# Patient Record
Sex: Female | Born: 1990
Health system: Southern US, Community
[De-identification: ages and names within clinical notes are randomized; demographics above are authoritative.]

## PROBLEM LIST (undated history)

## (undated) DIAGNOSIS — J45909 Unspecified asthma, uncomplicated: Secondary | ICD-10-CM

## (undated) DIAGNOSIS — Z98891 History of uterine scar from previous surgery: Secondary | ICD-10-CM

## (undated) DIAGNOSIS — G43909 Migraine, unspecified, not intractable, without status migrainosus: Secondary | ICD-10-CM

## (undated) DIAGNOSIS — D649 Anemia, unspecified: Secondary | ICD-10-CM

## (undated) DIAGNOSIS — Z9109 Other allergy status, other than to drugs and biological substances: Secondary | ICD-10-CM

## (undated) DIAGNOSIS — N390 Urinary tract infection, site not specified: Secondary | ICD-10-CM

## (undated) DIAGNOSIS — A599 Trichomoniasis, unspecified: Secondary | ICD-10-CM

## (undated) DIAGNOSIS — K589 Irritable bowel syndrome without diarrhea: Secondary | ICD-10-CM

## (undated) HISTORY — DX: Other allergy status, other than to drugs and biological substances: Z91.09

## (undated) HISTORY — DX: Urinary tract infection, site not specified: N39.0

## (undated) HISTORY — DX: Anemia, unspecified: D64.9

## (undated) HISTORY — DX: Unspecified asthma, uncomplicated: J45.909

## (undated) HISTORY — DX: Migraine, unspecified, not intractable, without status migrainosus: G43.909

## (undated) HISTORY — PX: NO PAST SURGERIES: SHX2092

---
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
## 2012-02-12 ENCOUNTER — Encounter (HOSPITAL_COMMUNITY): Payer: Self-pay

## 2012-02-12 ENCOUNTER — Emergency Department (HOSPITAL_COMMUNITY): Payer: No Typology Code available for payment source

## 2012-02-12 ENCOUNTER — Emergency Department (HOSPITAL_COMMUNITY)
Admission: EM | Admit: 2012-02-12 | Discharge: 2012-02-12 | Disposition: A | Discharge status: Home / Self Care | Payer: No Typology Code available for payment source | Attending: Emergency Medicine | Admitting: Emergency Medicine

## 2012-02-12 DIAGNOSIS — M25569 Pain in unspecified knee: Secondary | ICD-10-CM

## 2012-02-12 DIAGNOSIS — M25539 Pain in unspecified wrist: Secondary | ICD-10-CM | POA: Insufficient documentation

## 2012-02-12 DIAGNOSIS — M79609 Pain in unspecified limb: Secondary | ICD-10-CM | POA: Insufficient documentation

## 2012-02-12 DIAGNOSIS — Y92410 Unspecified street and highway as the place of occurrence of the external cause: Secondary | ICD-10-CM | POA: Insufficient documentation

## 2012-02-12 MED ORDER — IBUPROFEN 800 MG PO TABS: 800.0000 mg | ORAL_TABLET | Freq: Three times a day (TID) | ORAL | Status: DC

## 2012-02-12 MED ORDER — IBUPROFEN 800 MG PO TABS
800.0000 mg | ORAL_TABLET | Freq: Once | ORAL | Status: AC
  Administered 2012-02-12: 800 mg via ORAL
  Filled 2012-02-12: qty 1
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
## 2012-02-12 NOTE — Discharge Instructions (Signed)
Use ice and elevate above the heart tonight x 24 hours.  Followup with orthopedic physician if you are not better in a couple days. He today was normal did not show any fractures.   Knee Pain The knee is the complex joint between your thigh and your lower leg. It is made up of bones, tendons, ligaments, and cartilage. The bones that make up the knee are:  The femur in the thigh.   The tibia and fibula in the lower leg.   The patella or kneecap riding in the groove on the lower femur.  CAUSES  Knee pain is a common complaint with many causes. A few of these causes are:  Injury, such as:   A ruptured ligament or tendon injury.   Torn cartilage.   Medical conditions, such as:   Gout   Arthritis   Infections   Overuse, over training or overdoing a physical activity.  Knee pain can be minor or severe. Knee pain can accompany debilitating injury. Minor knee problems often respond well to self-care measures or get well on their own. More serious injuries may need medical intervention or even surgery. SYMPTOMS The knee is complex. Symptoms of knee problems can vary widely. Some of the problems are:  Pain with movement and weight bearing.   Swelling and tenderness.   Buckling of the knee.   Inability to straighten or extend your knee.   Your knee locks and you cannot straighten it.   Warmth and redness with pain and fever.   Deformity or dislocation of the kneecap.  DIAGNOSIS  Determining what is wrong may be very straight forward such as when there is an injury. It can also be challenging because of the complexity of the knee. Tests to make a diagnosis may include:  Your caregiver taking a history and doing a physical exam.   Routine X-rays can be used to rule out other problems. X-rays will not reveal a cartilage tear. Some injuries of the knee can be diagnosed by:   Arthroscopy a surgical technique by which a small video camera is inserted through tiny incisions on  the sides of the knee. This procedure is used to examine and repair internal knee joint problems. Tiny instruments can be used during arthroscopy to repair the torn knee cartilage (meniscus).   Arthrography is a radiology technique. A contrast liquid is directly injected into the knee joint. Internal structures of the knee joint then become visible on X-ray film.   An MRI scan is a non x-ray radiology procedure in which magnetic fields and a computer produce two- or three-dimensional images of the inside of the knee. Cartilage tears are often visible using an MRI scanner. MRI scans have largely replaced arthrography in diagnosing cartilage tears of the knee.   Blood work.   Examination of the fluid that helps to lubricate the knee joint (synovial fluid). This is done by taking a sample out using a needle and a syringe.  TREATMENT The treatment of knee problems depends on the cause. Some of these treatments are:  Depending on the injury, proper casting, splinting, surgery or physical therapy care will be needed.   Give yourself adequate recovery time. Do not overuse your joints. If you begin to get sore during workout routines, back off. Slow down or do fewer repetitions.   For repetitive activities such as cycling or running, maintain your strength and nutrition.   Alternate muscle groups. For example if you are a weight lifter, work the upper  body on one day and the lower body the next.   Either tight or weak muscles do not give the proper support for your knee. Tight or weak muscles do not absorb the stress placed on the knee joint. Keep the muscles surrounding the knee strong.   Take care of mechanical problems.   If you have flat feet, orthotics or special shoes may help. See your caregiver if you need help.   Arch supports, sometimes with wedges on the inner or outer aspect of the heel, can help. These can shift pressure away from the side of the knee most bothered by osteoarthritis.     A brace called an "unloader" brace also may be used to help ease the pressure on the most arthritic side of the knee.   If your caregiver has prescribed crutches, braces, wraps or ice, use as directed. The acronym for this is PRICE. This means protection, rest, ice, compression and elevation.   Nonsteroidal anti-inflammatory drugs (NSAID's), can help relieve pain. But if taken immediately after an injury, they may actually increase swelling. Take NSAID's with food in your stomach. Stop them if you develop stomach problems. Do not take these if you have a history of ulcers, stomach pain or bleeding from the bowel. Do not take without your caregiver's approval if you have problems with fluid retention, heart failure, or kidney problems.   For ongoing knee problems, physical therapy may be helpful.   Glucosamine and chondroitin are over-the-counter dietary supplements. Both may help relieve the pain of osteoarthritis in the knee. These medicines are different from the usual anti-inflammatory drugs. Glucosamine may decrease the rate of cartilage destruction.   Injections of a corticosteroid drug into your knee joint may help reduce the symptoms of an arthritis flare-up. They may provide pain relief that lasts a few months. You may have to wait a few months between injections. The injections do have a small increased risk of infection, water retention and elevated blood sugar levels.   Hyaluronic acid injected into damaged joints may ease pain and provide lubrication. These injections may work by reducing inflammation. A series of shots may give relief for as long as 6 months.   Topical painkillers. Applying certain ointments to your skin may help relieve the pain and stiffness of osteoarthritis. Ask your pharmacist for suggestions. Many over the-counter products are approved for temporary relief of arthritis pain.   In some countries, doctors often prescribe topical NSAID's for relief of chronic  conditions such as arthritis and tendinitis. A review of treatment with NSAID creams found that they worked as well as oral medications but without the serious side effects.  PREVENTION  Maintain a healthy weight. Extra pounds put more strain on your joints.   Get strong, stay limber. Weak muscles are a common cause of knee injuries. Stretching is important. Include flexibility exercises in your workouts.   Be smart about exercise. If you have osteoarthritis, chronic knee pain or recurring injuries, you may need to change the way you exercise. This does not mean you have to stop being active. If your knees ache after jogging or playing basketball, consider switching to swimming, water aerobics or other low-impact activities, at least for a few days a week. Sometimes limiting high-impact activities will provide relief.   Make sure your shoes fit well. Choose footwear that is right for your sport.   Protect your knees. Use the proper gear for knee-sensitive activities. Use kneepads when playing volleyball or laying carpet. Buckle your  seat belt every time you drive. Most shattered kneecaps occur in car accidents.   Rest when you are tired.  SEEK MEDICAL CARE IF:  You have knee pain that is continual and does not seem to be getting better.  SEEK IMMEDIATE MEDICAL CARE IF:  Your knee joint feels hot to the touch and you have a high fever. MAKE SURE YOU:   Understand these instructions.   Will watch your condition.   Will get help right away if you are not doing well or get worse.  Document Released: 10/14/2007 Document Revised: 08/29/2011 Document Reviewed: 10/14/2007 [employer] Patient Information 2012 [place], [place].
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
## 2012-02-12 NOTE — ED Notes (Signed)
Patient and family verbalized complete understanding of all the d/c instructions
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
## 2012-02-15 ENCOUNTER — Encounter (HOSPITAL_COMMUNITY): Payer: Self-pay | Admitting: *Deleted

## 2012-02-15 ENCOUNTER — Emergency Department (HOSPITAL_COMMUNITY)
Admission: EM | Admit: 2012-02-15 | Discharge: 2012-02-15 | Disposition: A | Discharge status: Home / Self Care | Payer: No Typology Code available for payment source | Attending: Emergency Medicine | Admitting: Emergency Medicine

## 2012-02-15 DIAGNOSIS — M79609 Pain in unspecified limb: Secondary | ICD-10-CM | POA: Insufficient documentation

## 2012-02-15 DIAGNOSIS — E669 Obesity, unspecified: Secondary | ICD-10-CM | POA: Insufficient documentation

## 2012-02-15 MED ORDER — CYCLOBENZAPRINE HCL 5 MG PO TABS: 5.0000 mg | ORAL_TABLET | Freq: Three times a day (TID) | ORAL | Status: AC | PRN

## 2012-02-15 NOTE — ED Provider Notes (Signed)
Medical screening examination/treatment/procedure(s) were performed by non-physician practitioner and as supervising physician I was immediately available for consultation/collaboration.   Nadalie Laughner L Areli Jowett, MD 02/15/12 1210 
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
## 2012-02-16 NOTE — ED Provider Notes (Signed)
Medical screening examination/treatment/procedure(s) were performed by non-physician practitioner and as supervising physician I was immediately available for consultation/collaboration.  Doug Sou, MD 02/16/12 [phone]

## 2012-11-20 ENCOUNTER — Emergency Department (HOSPITAL_COMMUNITY)
Admission: EM | Admit: 2012-11-20 | Discharge: 2012-11-20 | Payer: Managed Care, Other (non HMO) | Source: Home / Self Care

## 2012-11-21 ENCOUNTER — Ambulatory Visit (INDEPENDENT_AMBULATORY_CARE_PROVIDER_SITE_OTHER): Payer: Managed Care, Other (non HMO) | Admitting: Family Medicine

## 2012-11-21 VITALS — BP 115/76 | HR 75 | Temp 98.3°F | Resp 18 | Ht 69.5 in | Wt 363.0 lb

## 2012-11-21 DIAGNOSIS — K30 Functional dyspepsia: Secondary | ICD-10-CM

## 2012-11-21 DIAGNOSIS — R51 Headache: Secondary | ICD-10-CM

## 2012-11-21 DIAGNOSIS — K3183 Achlorhydria: Secondary | ICD-10-CM

## 2012-11-21 DIAGNOSIS — R109 Unspecified abdominal pain: Secondary | ICD-10-CM

## 2012-11-21 DIAGNOSIS — R404 Transient alteration of awareness: Secondary | ICD-10-CM

## 2012-11-21 LAB — BASIC METABOLIC PANEL
BUN: 7 mg/dL (ref 6–23)
CO2: 27 mEq/L (ref 19–32)
Chloride: 106 mEq/L (ref 96–112)
Creat: 0.71 mg/dL (ref 0.50–1.10)
Potassium: 4.2 mEq/L (ref 3.5–5.3)

## 2012-11-21 LAB — POCT CBC
Hemoglobin: 13 g/dL (ref 12.2–16.2)
Lymph, poc: 2.2 (ref 0.6–3.4)
MCH, POC: 23.6 pg — AB (ref 27–31.2)
MCV: 79.3 fL — AB (ref 80–97)
MID (cbc): 0.4 (ref 0–0.9)
POC LYMPH PERCENT: 34 %L (ref 10–50)
RBC: 5.51 M/uL — AB (ref 4.04–5.48)

## 2012-11-21 MED ORDER — BUTALBITAL-APAP-CAFFEINE 50-325-40 MG PO TABS: 1.0000 | ORAL_TABLET | Freq: Three times a day (TID) | ORAL | Status: AC | PRN

## 2012-11-21 MED ORDER — SUCRALFATE 1 G PO TABS: 1.0000 g | ORAL_TABLET | Freq: Four times a day (QID) | ORAL | Status: DC

## 2012-11-21 NOTE — Patient Instructions (Addendum)
You can try the fiorcet medication if your headache returns.  However, be careful not to overuse this medication.  Use the carafate before meals and before bed for the next several days.  If you are not better in the next day or so please come back in or call.    If you notice that you are having persistent headaches they could be due to your birth control pill and you may need to change this medication

## 2012-11-21 NOTE — Progress Notes (Signed)
[facility] [address], [place] [address] [phone]  Date:  11/21/2012   Name:  Sandra Logan   DOB:  [birth year]   MRN:  [id number]  PCP:  Kerby Nora, MD    Chief Complaint: Headache and Abdominal Pain   History of Present Illness:  Sandra Logan is a 22 y.o. very pleasant female patient who presents with the following:  She has been having HA for a couple of days.  She has a history of migraine HA- this seems like a migraine.  She has used ibuprofen for these in the past.  She has tried her ibuprofen about twice a day but it had not helped. However, her HA is actually now much better.  It had been a 5- 6/10, and is now "mild."  It was better when she awoke this morning.   She has pain in the front of her head.  She does have phonophobia and slight phonophobia.   She had some nausea yesterday but no vomiting.  She does not get aura.   No other neurological problems such as weakness or numbness.   She also has noted a "gassy feeling" for the last week.   She also had a loose stool 2 days ago.  She belly feels sore but no specific pain.    She is able to eat ok.   She has a mild cough but thinks this is unrelated.  No other symptoms such as fever or chills   LMP 11/10/12- she is on OCP.  She started this about 6 weeks ago and is on he 2nd pack now  It seems that [place] is most worried that her HA will come back and she will not have any way to treat it.  She also needs a note for her job  Patient Active Problem List  Diagnosis  . TINEA VERSICOLOR  . OBESITY, MORBID  . MIGRAINE, COMMON  . ALLERGIC RHINITIS, SEASONAL  . EXERCISE INDUCED ASTHMA  . INTERTRIGO  . KNEE PAIN, LEFT  . ANKLE PAIN, LEFT    Past Medical History  Diagnosis Date  . Environmental allergies   . Anemia   . Asthma     No past surgical history on file.  History  Substance Use Topics  . Smoking status: Never Smoker   . Smokeless tobacco: Not on file  .  Alcohol Use: No    No family history on file.  No Known Allergies  Medication list has been reviewed and updated.  Current Outpatient Prescriptions on File Prior to Visit  Medication Sig Dispense Refill  . ibuprofen (ADVIL,MOTRIN) 800 MG tablet Take 800 mg by mouth every 8 (eight) hours as needed. For pain.      . norgestimate-ethinyl estradiol (ORTHO-CYCLEN,SPRINTEC,PREVIFEM) 0.25-35 MG-MCG tablet Take 1 tablet by mouth daily.        Review of Systems:  As per HPI- otherwise negative.   Physical Examination: Filed Vitals:   11/21/12 1053  BP: 115/76  Pulse: 75  Temp: 98.3 F (36.8 C)  Resp: 18   Filed Vitals:   11/21/12 1053  Height: 5' 9.5" (1.765 m)  Weight: 363 lb (164.656 kg)   Body mass index is 52.84 kg/(m^2). Ideal Body Weight: Weight in (lb) to have BMI = 25: 171.4   GEN: WDWN, NAD, Non-toxic, A & O x 3, morbid obesity HEENT: Atraumatic, Normocephalic. Neck supple. No masses, No LAD. Bilateral TM wnl, oropharynx normal.  PEERL,EOMI.   Ears and  Nose: No external deformity. CV: RRR, No M/G/R. No JVD. No thrill. No extra heart sounds. PULM: CTA B, no wheezes, crackles, rhonchi. No retractions. No resp. distress. No accessory muscle use. ABD: S, NT, ND, +BS. No rebound. No HSM. EXTR: No c/c/e NEURO Normal gait. Normal strength, sensation and DTR all extremities.  PSYCH: Normally interactive. Conversant. Not depressed or anxious appearing.  Calm demeanor.   Results for orders placed in visit on 11/21/12  POCT CBC      Component Value Range   WBC 6.5  4.6 - 10.2 K/uL   Lymph, poc 2.2  0.6 - 3.4   POC LYMPH PERCENT 34.0  10 - 50 %L   MID (cbc) 0.4  0 - 0.9   POC MID % 6.2  0 - 12 %M   POC Granulocyte 3.9  2 - 6.9   Granulocyte percent 59.8  37 - 80 %G   RBC 5.51 (*) 4.04 - 5.48 M/uL   Hemoglobin 13.0  12.2 - 16.2 g/dL   HCT, POC 56.2  13.0 - 47.9 %   MCV 79.3 (*) 80 - 97 fL   MCH, POC 23.6 (*) 27 - 31.2 pg   MCHC 29.7 (*) 31.8 - 35.4 g/dL   RDW, POC  86.5     Platelet Count, POC 271  142 - 424 K/uL   MPV 9.9  0 - 99.8 fL   Advised her that her MCV may indicate a slight iron deficiency . However, she recently started OCP so this may correct.  Assessment and Plan: 1. Abdominal  pain, other specified site  POCT CBC, Basic metabolic panel  2. Headache  butalbital-acetaminophen-caffeine (FIORICET) 50-325-40 MG per tablet  3. Indigestion  sucralfate (CARAFATE) 1 G tablet   Gave rx for fiorcet to have on hand for future HA. Instructed to use sparingly.  Suspect she may have irritated her stomach by taking ibuprofen for her HA.  carafate Rx to use for the next several days.  See patient instructions for more details.     Abbe Amsterdam, MD

## 2012-11-22 ENCOUNTER — Encounter: Payer: Self-pay | Admitting: Family Medicine

## 2015-04-03 ENCOUNTER — Emergency Department (HOSPITAL_COMMUNITY)
Admission: EM | Admit: 2015-04-03 | Discharge: 2015-04-03 | Disposition: A | Discharge status: Home / Self Care | Payer: BLUE CROSS/BLUE SHIELD | Attending: Emergency Medicine | Admitting: Emergency Medicine

## 2015-04-03 ENCOUNTER — Encounter (HOSPITAL_COMMUNITY): Payer: Self-pay

## 2015-04-03 DIAGNOSIS — Z793 Long term (current) use of hormonal contraceptives: Secondary | ICD-10-CM | POA: Insufficient documentation

## 2015-04-03 DIAGNOSIS — Z3202 Encounter for pregnancy test, result negative: Secondary | ICD-10-CM | POA: Insufficient documentation

## 2015-04-03 DIAGNOSIS — N12 Tubulo-interstitial nephritis, not specified as acute or chronic: Secondary | ICD-10-CM | POA: Diagnosis not present

## 2015-04-03 DIAGNOSIS — Z79899 Other long term (current) drug therapy: Secondary | ICD-10-CM | POA: Insufficient documentation

## 2015-04-03 DIAGNOSIS — R1030 Lower abdominal pain, unspecified: Secondary | ICD-10-CM | POA: Diagnosis present

## 2015-04-03 DIAGNOSIS — J45909 Unspecified asthma, uncomplicated: Secondary | ICD-10-CM | POA: Diagnosis not present

## 2015-04-03 DIAGNOSIS — Z862 Personal history of diseases of the blood and blood-forming organs and certain disorders involving the immune mechanism: Secondary | ICD-10-CM | POA: Diagnosis not present

## 2015-04-03 LAB — COMPREHENSIVE METABOLIC PANEL
ALT: 14 U/L (ref 0–35)
ANION GAP: 7 (ref 5–15)
AST: 16 U/L (ref 0–37)
Albumin: 3.2 g/dL — ABNORMAL LOW (ref 3.5–5.2)
Alkaline Phosphatase: 45 U/L (ref 39–117)
BUN: 5 mg/dL — AB (ref 6–23)
CALCIUM: 8.7 mg/dL (ref 8.4–10.5)
CHLORIDE: 106 mmol/L (ref 96–112)
CO2: 24 mmol/L (ref 19–32)
Creatinine, Ser: 0.72 mg/dL (ref 0.50–1.10)
GFR calc Af Amer: >90 mL/min (ref >90)
GLUCOSE: 95 mg/dL (ref 70–99)
POTASSIUM: 3.5 mmol/L (ref 3.5–5.1)
Sodium: 137 mmol/L (ref 135–145)
Total Bilirubin: 0.5 mg/dL (ref 0.3–1.2)
Total Protein: 6.8 g/dL (ref 6.0–8.3)

## 2015-04-03 LAB — CBC WITH DIFFERENTIAL/PLATELET
Basophils Absolute: 0 10*3/uL (ref 0.0–0.1)
Basophils Relative: 0 % (ref 0–1)
EOS ABS: 0 10*3/uL (ref 0.0–0.7)
Eosinophils Relative: 0 % (ref 0–5)
HEMATOCRIT: 35.1 % — AB (ref 36.0–46.0)
HEMOGLOBIN: 11 g/dL — AB (ref 12.0–15.0)
LYMPHS PCT: 12 % (ref 12–46)
Lymphs Abs: 2.2 10*3/uL (ref 0.7–4.0)
MCH: 23.4 pg — AB (ref 26.0–34.0)
MCHC: 31.3 g/dL (ref 30.0–36.0)
MCV: 74.7 fL — ABNORMAL LOW (ref 78.0–100.0)
MONOS PCT: 6 % (ref 3–12)
Monocytes Absolute: 1.1 10*3/uL — ABNORMAL HIGH (ref 0.1–1.0)
Neutro Abs: 15.4 10*3/uL — ABNORMAL HIGH (ref 1.7–7.7)
Neutrophils Relative %: 82 % — ABNORMAL HIGH (ref 43–77)
PLATELETS: 299 10*3/uL (ref 150–400)
RBC: 4.7 MIL/uL (ref 3.87–5.11)
RDW: 14.3 % (ref 11.5–15.5)
WBC: 18.7 10*3/uL — AB (ref 4.0–10.5)

## 2015-04-03 LAB — URINE MICROSCOPIC-ADD ON

## 2015-04-03 LAB — URINALYSIS, ROUTINE W REFLEX MICROSCOPIC
Glucose, UA: NEGATIVE mg/dL
Ketones, ur: 15 mg/dL — AB
Nitrite: NEGATIVE
PH: 6 (ref 5.0–8.0)
PROTEIN: 30 mg/dL — AB
SPECIFIC GRAVITY, URINE: 1.035 — AB (ref 1.005–1.030)
Urobilinogen, UA: 1 mg/dL (ref 0.0–1.0)

## 2015-04-03 LAB — I-STAT BETA HCG BLOOD, ED (MC, WL, AP ONLY): I-stat hCG, quantitative: <5 m[IU]/mL (ref <5)

## 2015-04-03 MED ORDER — OXYCODONE-ACETAMINOPHEN 5-325 MG PO TABS
1.0000 | ORAL_TABLET | Freq: Once | ORAL | Status: AC
  Administered 2015-04-03: 1 via ORAL
  Filled 2015-04-03: qty 1

## 2015-04-03 MED ORDER — CEPHALEXIN 500 MG PO CAPS: 500.0000 mg | ORAL_CAPSULE | Freq: Three times a day (TID) | ORAL | Status: DC

## 2015-04-03 MED ORDER — ONDANSETRON 8 MG PO TBDP: 8.0000 mg | ORAL_TABLET | Freq: Three times a day (TID) | ORAL | Status: DC | PRN

## 2015-04-03 MED ORDER — IOHEXOL 300 MG/ML  SOLN
25.0000 mL | Freq: Once | INTRAMUSCULAR | Status: AC | PRN
  Administered 2015-04-03: 25 mL via ORAL

## 2015-04-03 MED ORDER — ONDANSETRON HCL 4 MG/2ML IJ SOLN
4.0000 mg | Freq: Once | INTRAMUSCULAR | Status: AC
  Administered 2015-04-03: 4 mg via INTRAVENOUS
  Filled 2015-04-03: qty 2

## 2015-04-03 MED ORDER — HYDROCODONE-ACETAMINOPHEN 5-325 MG PO TABS: 1.0000 | ORAL_TABLET | ORAL | Status: DC | PRN

## 2015-04-03 MED ORDER — MORPHINE SULFATE 4 MG/ML IJ SOLN
6.0000 mg | INTRAMUSCULAR | Status: DC | PRN
  Administered 2015-04-03: 6 mg via INTRAVENOUS
  Filled 2015-04-03: qty 2

## 2015-04-03 MED ORDER — DEXTROSE 5 % IV SOLN
1.0000 g | Freq: Once | INTRAVENOUS | Status: AC
  Administered 2015-04-03: 1 g via INTRAVENOUS
  Filled 2015-04-03: qty 10

## 2015-04-03 NOTE — ED Notes (Signed)
Pt c/o sharp RLQ abdominal pain x2 days. Pt c/o 2x vomiting, 1x diarrhea. Pt unable to tolerate food/fluid. Pt reports hx GI upset in past.

## 2015-04-03 NOTE — ED Provider Notes (Signed)
CSN: [id number]     Arrival date & time 04/03/15  0453 History   First MD Initiated Contact with Patient 04/03/15 [phone]     Chief Complaint  Patient presents with  . Abdominal Pain      HPI Patient presents to the emergency department with lower abdominal pain over the past 2 days.  She reports vomiting yesterday.  No hematemesis.  No melena or hematochezia.  She did have one diarrheal episode yesterday.  She reports a new sensation of urinary pressure when she attempts to urinate.  She denies pain when she urinates.  She denies flank pain.  No fevers or chills.  Pain is moderate in severity.  No other complaints.  No vaginal complaints.  No new vaginal discharge.   Past Medical History  Diagnosis Date  . Environmental allergies   . Anemia   . Asthma    History reviewed. No pertinent past surgical history. History reviewed. No pertinent family history. History  Substance Use Topics  . Smoking status: Never Smoker   . Smokeless tobacco: Not on file  . Alcohol Use: No   OB History    No data available     Review of Systems  All other systems reviewed and are negative.     Allergies  Review of patient's allergies indicates no known allergies.  Home Medications   Prior to Admission medications   Medication Sig Start Date End Date Taking? Authorizing Provider  ibuprofen (ADVIL,MOTRIN) 800 MG tablet Take 800 mg by mouth every 8 (eight) hours as needed. For pain.    Historical Provider, MD  norgestimate-ethinyl estradiol (ORTHO-CYCLEN,SPRINTEC,PREVIFEM) 0.25-35 MG-MCG tablet Take 1 tablet by mouth daily.    Historical Provider, MD  sucralfate (CARAFATE) 1 G tablet Take 1 tablet (1 g total) by mouth 4 (four) times daily. 11/21/12   Gwenlyn Found Copland, MD   BP 110/40 mmHg  Pulse 62  Temp(Src) 98.6 F (37 C) (Oral)  Resp 16  SpO2 99%  LMP 03/30/2015 Physical Exam  Constitutional: She is oriented to person, place, and time. She appears well-developed and well-nourished. No  distress.  HENT:  Head: Normocephalic and atraumatic.  Eyes: EOM are normal.  Neck: Normal range of motion.  Cardiovascular: Normal rate, regular rhythm and normal heart sounds.   Pulmonary/Chest: Effort normal and breath sounds normal.  Abdominal: Soft. She exhibits no distension.  Mild suprapubic tenderness without guarding or rebound.  Musculoskeletal: Normal range of motion.  Neurological: She is alert and oriented to person, place, and time.  Skin: Skin is warm and dry.  Psychiatric: She has a normal mood and affect. Judgment normal.  Nursing note and vitals reviewed.   ED Course  Procedures (including critical care time) Labs Review Labs Reviewed  CBC WITH DIFFERENTIAL/PLATELET - Abnormal; Notable for the following:    WBC 18.7 (*)    Hemoglobin 11.0 (*)    HCT 35.1 (*)    MCV 74.7 (*)    MCH 23.4 (*)    Neutrophils Relative % 82 (*)    Neutro Abs 15.4 (*)    Monocytes Absolute 1.1 (*)    All other components within normal limits  COMPREHENSIVE METABOLIC PANEL - Abnormal; Notable for the following:    BUN 5 (*)    Albumin 3.2 (*)    All other components within normal limits  URINALYSIS, ROUTINE W REFLEX MICROSCOPIC - Abnormal; Notable for the following:    Color, Urine AMBER (*)    APPearance TURBID (*)  Specific Gravity, Urine 1.035 (*)    Hgb urine dipstick SMALL (*)    Bilirubin Urine SMALL (*)    Ketones, ur 15 (*)    Protein, ur 30 (*)    Leukocytes, UA LARGE (*)    All other components within normal limits  URINE MICROSCOPIC-ADD ON - Abnormal; Notable for the following:    Squamous Epithelial / LPF MANY (*)    Bacteria, UA MANY (*)    All other components within normal limits  URINE CULTURE  I-STAT BETA HCG BLOOD, ED ([facility], [facility], [facility] ONLY)    Imaging Review No results found.   EKG Interpretation None      MDM   Final diagnoses:  None    8:19 AM Patient is feeling better at this time.  A suspect this is cystitis with developing  pyelonephritis.  Vital signs are normal.  Doubt complicated UTI as I don't think she has a stone associated with this.  She's feeling much better after pain medication.  Rocephin given in emergency department.  Home with antibiotics and pain medication.  She understands to return to the ER for new or worsening symptoms.    Azalia BilisKevin Fabyan Loughmiller, MD 04/03/15 [phone]

## 2015-04-03 NOTE — Discharge Instructions (Signed)
Pyelonephritis, Adult °Pyelonephritis is a kidney infection. In general, there are 2 main types of pyelonephritis: °· Infections that come on quickly without any warning (acute pyelonephritis). °· Infections that persist for a long period of time (chronic pyelonephritis). °CAUSES  °Two main causes of pyelonephritis are: °· Bacteria traveling from the bladder to the kidney. This is a problem especially in pregnant women. The urine in the bladder can become filled with bacteria from multiple causes, including: °¨ Inflammation of the prostate gland (prostatitis). °¨ Sexual intercourse in females. °¨ Bladder infection (cystitis). °· Bacteria traveling from the bloodstream to the tissue part of the kidney. °Problems that may increase your risk of getting a kidney infection include: °· Diabetes. °· Kidney stones or bladder stones. °· Cancer. °· Catheters placed in the bladder. °· Other abnormalities of the kidney or ureter. °SYMPTOMS  °· Abdominal pain. °· Pain in the side or flank area. °· Fever. °· Chills. °· Upset stomach. °· Blood in the urine (dark urine). °· Frequent urination. °· Strong or persistent urge to urinate. °· Burning or stinging when urinating. °DIAGNOSIS  °Your caregiver may diagnose your kidney infection based on your symptoms. A urine sample may also be taken. °TREATMENT  °In general, treatment depends on how severe the infection is.  °· If the infection is mild and caught early, your caregiver may treat you with oral antibiotics and send you home. °· If the infection is more severe, the bacteria may have gotten into the bloodstream. This will require intravenous (IV) antibiotics and a hospital stay. Symptoms may include: °¨ High fever. °¨ Severe flank pain. °¨ Shaking chills. °· Even after a hospital stay, your caregiver may require you to be on oral antibiotics for a period of time. °· Other treatments may be required depending upon the cause of the infection. °HOME CARE INSTRUCTIONS  °· Take your  antibiotics as directed. Finish them even if you start to feel better. °· Make an appointment to have your urine checked to make sure the infection is gone. °· Drink enough fluids to keep your urine clear or pale yellow. °· Take medicines for the bladder if you have urgency and frequency of urination as directed by your caregiver. °SEEK IMMEDIATE MEDICAL CARE IF:  °· You have a fever or persistent symptoms for more than 2-3 days. °· You have a fever and your symptoms suddenly get worse. °· You are unable to take your antibiotics or fluids. °· You develop shaking chills. °· You experience extreme weakness or fainting. °· There is no improvement after 2 days of treatment. °MAKE SURE YOU: °· Understand these instructions. °· Will watch your condition. °· Will get help right away if you are not doing well or get worse. °Document Released: 12/17/2005 Document Revised: 06/17/2012 Document Reviewed: 05/23/2011 °ExitCare® Patient Information ©2015 [employer]. This information is not intended to replace advice given to you by your health care provider. Make sure you discuss any questions you have with your health care provider. ° °

## 2015-04-04 LAB — URINE CULTURE

## 2015-04-05 ENCOUNTER — Encounter (HOSPITAL_BASED_OUTPATIENT_CLINIC_OR_DEPARTMENT_OTHER): Payer: Self-pay | Admitting: Emergency Medicine

## 2016-08-10 DIAGNOSIS — R399 Unspecified symptoms and signs involving the genitourinary system: Secondary | ICD-10-CM | POA: Diagnosis not present

## 2017-06-26 DIAGNOSIS — Z3A15 15 weeks gestation of pregnancy: Secondary | ICD-10-CM | POA: Diagnosis not present

## 2017-06-26 DIAGNOSIS — Z3490 Encounter for supervision of normal pregnancy, unspecified, unspecified trimester: Secondary | ICD-10-CM | POA: Insufficient documentation

## 2017-06-26 DIAGNOSIS — O99212 Obesity complicating pregnancy, second trimester: Secondary | ICD-10-CM | POA: Diagnosis not present

## 2017-06-26 DIAGNOSIS — N911 Secondary amenorrhea: Secondary | ICD-10-CM | POA: Diagnosis not present

## 2017-06-26 DIAGNOSIS — Z363 Encounter for antenatal screening for malformations: Secondary | ICD-10-CM | POA: Diagnosis not present

## 2017-06-26 DIAGNOSIS — O0932 Supervision of pregnancy with insufficient antenatal care, second trimester: Secondary | ICD-10-CM | POA: Diagnosis not present

## 2017-07-04 DIAGNOSIS — Z3A16 16 weeks gestation of pregnancy: Secondary | ICD-10-CM | POA: Diagnosis not present

## 2017-07-04 DIAGNOSIS — Z113 Encounter for screening for infections with a predominantly sexual mode of transmission: Secondary | ICD-10-CM | POA: Diagnosis not present

## 2017-07-04 DIAGNOSIS — O99212 Obesity complicating pregnancy, second trimester: Secondary | ICD-10-CM | POA: Diagnosis not present

## 2017-07-04 DIAGNOSIS — O99512 Diseases of the respiratory system complicating pregnancy, second trimester: Secondary | ICD-10-CM | POA: Diagnosis not present

## 2017-07-04 DIAGNOSIS — Z124 Encounter for screening for malignant neoplasm of cervix: Secondary | ICD-10-CM | POA: Diagnosis not present

## 2017-07-04 LAB — OB RESULTS CONSOLE ANTIBODY SCREEN: ANTIBODY SCREEN: NEGATIVE

## 2017-07-04 LAB — OB RESULTS CONSOLE ABO/RH: RH Type: POSITIVE

## 2017-07-05 ENCOUNTER — Other Ambulatory Visit (HOSPITAL_COMMUNITY): Payer: Self-pay | Admitting: Obstetrics and Gynecology

## 2017-07-05 DIAGNOSIS — Z3689 Encounter for other specified antenatal screening: Secondary | ICD-10-CM

## 2017-07-05 DIAGNOSIS — O99212 Obesity complicating pregnancy, second trimester: Secondary | ICD-10-CM

## 2017-07-16 ENCOUNTER — Encounter (HOSPITAL_COMMUNITY): Payer: Self-pay

## 2017-07-18 ENCOUNTER — Encounter (HOSPITAL_COMMUNITY): Payer: Self-pay

## 2017-07-18 ENCOUNTER — Ambulatory Visit (HOSPITAL_COMMUNITY)
Admission: RE | Admit: 2017-07-18 | Discharge: 2017-07-18 | Disposition: A | Discharge status: Home / Self Care | Payer: BLUE CROSS/BLUE SHIELD | Source: Ambulatory Visit | Attending: Obstetrics and Gynecology | Admitting: Obstetrics and Gynecology

## 2017-07-18 HISTORY — DX: Trichomoniasis, unspecified: A59.9

## 2017-07-29 ENCOUNTER — Ambulatory Visit (HOSPITAL_COMMUNITY)
Admission: RE | Admit: 2017-07-29 | Discharge: 2017-07-29 | Disposition: A | Discharge status: Home / Self Care | Payer: BLUE CROSS/BLUE SHIELD | Source: Ambulatory Visit | Attending: Obstetrics and Gynecology | Admitting: Obstetrics and Gynecology

## 2017-07-29 ENCOUNTER — Other Ambulatory Visit (HOSPITAL_COMMUNITY): Payer: Self-pay | Admitting: *Deleted

## 2017-07-29 ENCOUNTER — Encounter (HOSPITAL_COMMUNITY): Payer: Self-pay

## 2017-07-29 DIAGNOSIS — Z3689 Encounter for other specified antenatal screening: Secondary | ICD-10-CM

## 2017-07-29 DIAGNOSIS — IMO0002 Reserved for concepts with insufficient information to code with codable children: Secondary | ICD-10-CM

## 2017-07-29 DIAGNOSIS — Z3A20 20 weeks gestation of pregnancy: Secondary | ICD-10-CM | POA: Diagnosis not present

## 2017-07-29 DIAGNOSIS — O99212 Obesity complicating pregnancy, second trimester: Secondary | ICD-10-CM

## 2017-07-29 DIAGNOSIS — Z0489 Encounter for examination and observation for other specified reasons: Secondary | ICD-10-CM

## 2017-08-11 DIAGNOSIS — Z3A22 22 weeks gestation of pregnancy: Secondary | ICD-10-CM | POA: Diagnosis not present

## 2017-08-11 DIAGNOSIS — R309 Painful micturition, unspecified: Secondary | ICD-10-CM | POA: Diagnosis not present

## 2017-08-11 DIAGNOSIS — O98912 Unspecified maternal infectious and parasitic disease complicating pregnancy, second trimester: Secondary | ICD-10-CM | POA: Diagnosis not present

## 2017-08-11 DIAGNOSIS — R8299 Other abnormal findings in urine: Secondary | ICD-10-CM | POA: Diagnosis not present

## 2017-08-26 ENCOUNTER — Ambulatory Visit (HOSPITAL_COMMUNITY)
Admission: RE | Admit: 2017-08-26 | Discharge: 2017-08-26 | Disposition: A | Discharge status: Home / Self Care | Payer: BLUE CROSS/BLUE SHIELD | Source: Ambulatory Visit | Attending: Obstetrics and Gynecology | Admitting: Obstetrics and Gynecology

## 2017-08-26 ENCOUNTER — Encounter (HOSPITAL_COMMUNITY): Payer: Self-pay

## 2017-08-26 DIAGNOSIS — Z3A24 24 weeks gestation of pregnancy: Secondary | ICD-10-CM | POA: Insufficient documentation

## 2017-08-26 DIAGNOSIS — Z6841 Body Mass Index (BMI) 40.0 and over, adult: Secondary | ICD-10-CM | POA: Insufficient documentation

## 2017-08-26 DIAGNOSIS — O99212 Obesity complicating pregnancy, second trimester: Secondary | ICD-10-CM | POA: Diagnosis not present

## 2017-08-26 DIAGNOSIS — Z363 Encounter for antenatal screening for malformations: Secondary | ICD-10-CM | POA: Diagnosis present

## 2017-08-26 DIAGNOSIS — Z3689 Encounter for other specified antenatal screening: Secondary | ICD-10-CM | POA: Diagnosis not present

## 2017-08-26 DIAGNOSIS — IMO0002 Reserved for concepts with insufficient information to code with codable children: Secondary | ICD-10-CM

## 2017-08-26 DIAGNOSIS — Z0489 Encounter for examination and observation for other specified reasons: Secondary | ICD-10-CM

## 2017-08-27 ENCOUNTER — Other Ambulatory Visit (HOSPITAL_COMMUNITY): Payer: Self-pay | Admitting: *Deleted

## 2017-08-27 DIAGNOSIS — O358XX0 Maternal care for other (suspected) fetal abnormality and damage, not applicable or unspecified: Secondary | ICD-10-CM

## 2017-08-27 DIAGNOSIS — O35EXX0 Maternal care for other (suspected) fetal abnormality and damage, fetal genitourinary anomalies, not applicable or unspecified: Secondary | ICD-10-CM

## 2017-08-28 ENCOUNTER — Encounter (HOSPITAL_COMMUNITY): Payer: Self-pay

## 2017-08-28 ENCOUNTER — Other Ambulatory Visit (HOSPITAL_COMMUNITY): Payer: Self-pay

## 2017-09-19 ENCOUNTER — Encounter (HOSPITAL_COMMUNITY): Payer: Self-pay

## 2017-09-23 ENCOUNTER — Ambulatory Visit (HOSPITAL_COMMUNITY): Admission: RE | Admit: 2017-09-23 | Payer: BLUE CROSS/BLUE SHIELD | Source: Ambulatory Visit

## 2017-09-23 DIAGNOSIS — Z3689 Encounter for other specified antenatal screening: Secondary | ICD-10-CM | POA: Diagnosis not present

## 2017-09-23 DIAGNOSIS — Z23 Encounter for immunization: Secondary | ICD-10-CM | POA: Diagnosis not present

## 2017-09-23 DIAGNOSIS — Z3A28 28 weeks gestation of pregnancy: Secondary | ICD-10-CM | POA: Diagnosis not present

## 2017-09-23 HISTORY — DX: Irritable bowel syndrome, unspecified: K58.9

## 2017-10-08 DIAGNOSIS — Z23 Encounter for immunization: Secondary | ICD-10-CM | POA: Diagnosis not present

## 2017-10-08 DIAGNOSIS — O09293 Supervision of pregnancy with other poor reproductive or obstetric history, third trimester: Secondary | ICD-10-CM | POA: Diagnosis not present

## 2017-10-08 DIAGNOSIS — O99213 Obesity complicating pregnancy, third trimester: Secondary | ICD-10-CM | POA: Diagnosis not present

## 2017-10-08 DIAGNOSIS — Z3A30 30 weeks gestation of pregnancy: Secondary | ICD-10-CM | POA: Diagnosis not present

## 2017-11-05 ENCOUNTER — Encounter (HOSPITAL_COMMUNITY): Payer: Self-pay

## 2017-11-05 ENCOUNTER — Other Ambulatory Visit (HOSPITAL_COMMUNITY): Payer: Self-pay | Admitting: *Deleted

## 2017-11-05 ENCOUNTER — Ambulatory Visit (HOSPITAL_COMMUNITY)
Admission: RE | Admit: 2017-11-05 | Discharge: 2017-11-05 | Disposition: A | Discharge status: Home / Self Care | Payer: BLUE CROSS/BLUE SHIELD | Source: Ambulatory Visit | Attending: Obstetrics and Gynecology | Admitting: Obstetrics and Gynecology

## 2017-11-05 DIAGNOSIS — Z3A34 34 weeks gestation of pregnancy: Secondary | ICD-10-CM | POA: Diagnosis not present

## 2017-11-05 DIAGNOSIS — O358XX0 Maternal care for other (suspected) fetal abnormality and damage, not applicable or unspecified: Secondary | ICD-10-CM

## 2017-11-05 DIAGNOSIS — O35EXX0 Maternal care for other (suspected) fetal abnormality and damage, fetal genitourinary anomalies, not applicable or unspecified: Secondary | ICD-10-CM

## 2017-11-05 DIAGNOSIS — O99213 Obesity complicating pregnancy, third trimester: Secondary | ICD-10-CM | POA: Insufficient documentation

## 2017-11-05 NOTE — Addendum Note (Signed)
Encounter addended by: Genevie CheshireWaken, Dujuan Stankowski M, RT on: 11/05/2017 12:37 PM  Actions taken: Imaging Exam ended

## 2017-11-12 DIAGNOSIS — Z3A35 35 weeks gestation of pregnancy: Secondary | ICD-10-CM | POA: Diagnosis not present

## 2017-11-12 DIAGNOSIS — Z113 Encounter for screening for infections with a predominantly sexual mode of transmission: Secondary | ICD-10-CM | POA: Diagnosis not present

## 2017-11-12 DIAGNOSIS — Z3685 Encounter for antenatal screening for Streptococcus B: Secondary | ICD-10-CM | POA: Diagnosis not present

## 2017-11-26 ENCOUNTER — Ambulatory Visit (HOSPITAL_COMMUNITY)
Admission: RE | Admit: 2017-11-26 | Discharge: 2017-11-26 | Disposition: A | Discharge status: Home / Self Care | Payer: BLUE CROSS/BLUE SHIELD | Source: Ambulatory Visit | Attending: Obstetrics and Gynecology | Admitting: Obstetrics and Gynecology

## 2017-11-26 ENCOUNTER — Other Ambulatory Visit (HOSPITAL_COMMUNITY): Payer: Self-pay | Admitting: Obstetrics and Gynecology

## 2017-11-26 ENCOUNTER — Encounter (HOSPITAL_COMMUNITY): Payer: Self-pay

## 2017-11-26 DIAGNOSIS — Z362 Encounter for other antenatal screening follow-up: Secondary | ICD-10-CM

## 2017-11-26 DIAGNOSIS — O358XX0 Maternal care for other (suspected) fetal abnormality and damage, not applicable or unspecified: Secondary | ICD-10-CM

## 2017-11-26 DIAGNOSIS — O35EXX0 Maternal care for other (suspected) fetal abnormality and damage, fetal genitourinary anomalies, not applicable or unspecified: Secondary | ICD-10-CM

## 2017-11-26 DIAGNOSIS — O99213 Obesity complicating pregnancy, third trimester: Secondary | ICD-10-CM | POA: Diagnosis not present

## 2017-11-26 DIAGNOSIS — Z3A37 37 weeks gestation of pregnancy: Secondary | ICD-10-CM

## 2017-11-27 ENCOUNTER — Other Ambulatory Visit (HOSPITAL_COMMUNITY): Payer: Self-pay | Admitting: Obstetrics and Gynecology

## 2017-11-27 DIAGNOSIS — O358XX0 Maternal care for other (suspected) fetal abnormality and damage, not applicable or unspecified: Secondary | ICD-10-CM

## 2017-11-27 DIAGNOSIS — Z3A39 39 weeks gestation of pregnancy: Secondary | ICD-10-CM

## 2017-11-27 DIAGNOSIS — O35EXX0 Maternal care for other (suspected) fetal abnormality and damage, fetal genitourinary anomalies, not applicable or unspecified: Secondary | ICD-10-CM

## 2017-12-09 ENCOUNTER — Other Ambulatory Visit: Payer: Self-pay

## 2017-12-09 ENCOUNTER — Inpatient Hospital Stay (HOSPITAL_COMMUNITY): Payer: BLUE CROSS/BLUE SHIELD | Admitting: Anesthesiology

## 2017-12-09 ENCOUNTER — Inpatient Hospital Stay (HOSPITAL_COMMUNITY)
Admission: AD | Admit: 2017-12-09 | Discharge: 2017-12-12 | DRG: 788 | Disposition: A | Discharge status: Home / Self Care | Payer: BLUE CROSS/BLUE SHIELD | Source: Ambulatory Visit | Attending: Obstetrics and Gynecology | Admitting: Obstetrics and Gynecology

## 2017-12-09 ENCOUNTER — Encounter (HOSPITAL_COMMUNITY)
Admission: AD | Disposition: A | Discharge status: Home / Self Care | Payer: Self-pay | Source: Ambulatory Visit | Attending: Obstetrics and Gynecology

## 2017-12-09 ENCOUNTER — Encounter (HOSPITAL_COMMUNITY): Payer: Self-pay | Admitting: *Deleted

## 2017-12-09 DIAGNOSIS — O9962 Diseases of the digestive system complicating childbirth: Secondary | ICD-10-CM | POA: Diagnosis present

## 2017-12-09 DIAGNOSIS — K589 Irritable bowel syndrome without diarrhea: Secondary | ICD-10-CM | POA: Diagnosis not present

## 2017-12-09 DIAGNOSIS — Z98891 History of uterine scar from previous surgery: Secondary | ICD-10-CM

## 2017-12-09 DIAGNOSIS — R030 Elevated blood-pressure reading, without diagnosis of hypertension: Secondary | ICD-10-CM | POA: Diagnosis not present

## 2017-12-09 DIAGNOSIS — O3663X0 Maternal care for excessive fetal growth, third trimester, not applicable or unspecified: Secondary | ICD-10-CM | POA: Diagnosis present

## 2017-12-09 DIAGNOSIS — O99214 Obesity complicating childbirth: Secondary | ICD-10-CM | POA: Diagnosis not present

## 2017-12-09 DIAGNOSIS — D649 Anemia, unspecified: Secondary | ICD-10-CM | POA: Diagnosis present

## 2017-12-09 DIAGNOSIS — Z3A39 39 weeks gestation of pregnancy: Secondary | ICD-10-CM | POA: Diagnosis not present

## 2017-12-09 DIAGNOSIS — Z3A00 Weeks of gestation of pregnancy not specified: Secondary | ICD-10-CM | POA: Diagnosis not present

## 2017-12-09 DIAGNOSIS — O9902 Anemia complicating childbirth: Secondary | ICD-10-CM | POA: Diagnosis not present

## 2017-12-09 DIAGNOSIS — O358XX0 Maternal care for other (suspected) fetal abnormality and damage, not applicable or unspecified: Secondary | ICD-10-CM | POA: Diagnosis present

## 2017-12-09 DIAGNOSIS — O139 Gestational [pregnancy-induced] hypertension without significant proteinuria, unspecified trimester: Secondary | ICD-10-CM | POA: Diagnosis present

## 2017-12-09 DIAGNOSIS — O34211 Maternal care for low transverse scar from previous cesarean delivery: Secondary | ICD-10-CM | POA: Diagnosis not present

## 2017-12-09 DIAGNOSIS — O134 Gestational [pregnancy-induced] hypertension without significant proteinuria, complicating childbirth: Secondary | ICD-10-CM | POA: Diagnosis not present

## 2017-12-09 DIAGNOSIS — O133 Gestational [pregnancy-induced] hypertension without significant proteinuria, third trimester: Secondary | ICD-10-CM

## 2017-12-09 HISTORY — DX: History of uterine scar from previous surgery: Z98.891

## 2017-12-09 LAB — COMPREHENSIVE METABOLIC PANEL
ALK PHOS: 114 U/L (ref 38–126)
ALT: 15 U/L (ref 14–54)
AST: 22 U/L (ref 15–41)
Albumin: 2.6 g/dL — ABNORMAL LOW (ref 3.5–5.0)
Anion gap: 12 (ref 5–15)
BILIRUBIN TOTAL: 0.3 mg/dL (ref 0.3–1.2)
BUN: 9 mg/dL (ref 6–20)
CALCIUM: 9.2 mg/dL (ref 8.9–10.3)
CO2: 17 mmol/L — AB (ref 22–32)
CREATININE: 0.72 mg/dL (ref 0.44–1.00)
Chloride: 108 mmol/L (ref 101–111)
GFR calc Af Amer: >60 mL/min (ref >60)
GFR calc non Af Amer: >60 mL/min (ref >60)
GLUCOSE: 74 mg/dL (ref 65–99)
Potassium: 4.2 mmol/L (ref 3.5–5.1)
SODIUM: 137 mmol/L (ref 135–145)
TOTAL PROTEIN: 7 g/dL (ref 6.5–8.1)

## 2017-12-09 LAB — CBC
HEMATOCRIT: 34 % — AB (ref 36.0–46.0)
HEMOGLOBIN: 10.7 g/dL — AB (ref 12.0–15.0)
MCH: 24.1 pg — AB (ref 26.0–34.0)
MCHC: 31.5 g/dL (ref 30.0–36.0)
MCV: 76.6 fL — ABNORMAL LOW (ref 78.0–100.0)
Platelets: 380 10*3/uL (ref 150–400)
RBC: 4.44 MIL/uL (ref 3.87–5.11)
RDW: 14.3 % (ref 11.5–15.5)
WBC: 11.6 10*3/uL — ABNORMAL HIGH (ref 4.0–10.5)

## 2017-12-09 LAB — PROTEIN / CREATININE RATIO, URINE
Creatinine, Urine: 289 mg/dL
Protein Creatinine Ratio: 0.13 mg/mg{Cre} (ref 0.00–0.15)
Total Protein, Urine: 39 mg/dL

## 2017-12-09 LAB — TYPE AND SCREEN
ABO/RH(D): B POS
Antibody Screen: NEGATIVE

## 2017-12-09 LAB — ABO/RH: ABO/RH(D): B POS

## 2017-12-09 SURGERY — Surgical Case
Anesthesia: Regional

## 2017-12-09 MED ORDER — KETOROLAC TROMETHAMINE 30 MG/ML IJ SOLN: 30.0000 mg | Freq: Four times a day (QID) | INTRAMUSCULAR | Status: AC | PRN

## 2017-12-09 MED ORDER — HYDRALAZINE HCL 20 MG/ML IJ SOLN
10.0000 mg | Freq: Once | INTRAMUSCULAR | Status: DC | PRN
Start: 2017-12-09 — End: 2017-12-12

## 2017-12-09 MED ORDER — NALBUPHINE HCL 10 MG/ML IJ SOLN: 5.0000 mg | Freq: Once | INTRAMUSCULAR | Status: DC | PRN

## 2017-12-09 MED ORDER — KETOROLAC TROMETHAMINE 30 MG/ML IJ SOLN
INTRAMUSCULAR | Status: AC
  Filled 2017-12-09: qty 1

## 2017-12-09 MED ORDER — OXYCODONE HCL 5 MG/5ML PO SOLN: 5.0000 mg | Freq: Once | ORAL | Status: DC | PRN

## 2017-12-09 MED ORDER — ACETAMINOPHEN 325 MG PO TABS: 650.0000 mg | ORAL_TABLET | ORAL | Status: DC | PRN

## 2017-12-09 MED ORDER — SIMETHICONE 80 MG PO CHEW
80.0000 mg | CHEWABLE_TABLET | Freq: Three times a day (TID) | ORAL | Status: DC
  Administered 2017-12-10 – 2017-12-12 (×6): 80 mg via ORAL
  Filled 2017-12-09 (×6): qty 1

## 2017-12-09 MED ORDER — MENTHOL 3 MG MT LOZG: 1.0000 | LOZENGE | OROMUCOSAL | Status: DC | PRN

## 2017-12-09 MED ORDER — DEXTROSE 5 % IV SOLN
3.0000 g | INTRAVENOUS | Status: DC
  Filled 2017-12-09: qty 3000

## 2017-12-09 MED ORDER — SIMETHICONE 80 MG PO CHEW
80.0000 mg | CHEWABLE_TABLET | ORAL | Status: DC
  Administered 2017-12-10 – 2017-12-12 (×3): 80 mg via ORAL
  Filled 2017-12-09 (×3): qty 1

## 2017-12-09 MED ORDER — MEPERIDINE HCL 25 MG/ML IJ SOLN
6.2500 mg | INTRAMUSCULAR | Status: DC | PRN
Start: 2017-12-09 — End: 2017-12-09

## 2017-12-09 MED ORDER — TETANUS-DIPHTH-ACELL PERTUSSIS 5-2.5-18.5 LF-MCG/0.5 IM SUSP: 0.5000 mL | Freq: Once | INTRAMUSCULAR | Status: DC

## 2017-12-09 MED ORDER — LACTATED RINGERS IV SOLN: INTRAVENOUS | Status: DC

## 2017-12-09 MED ORDER — PROPOFOL 10 MG/ML IV BOLUS
INTRAVENOUS | Status: AC
  Filled 2017-12-09: qty 20

## 2017-12-09 MED ORDER — OXYCODONE-ACETAMINOPHEN 5-325 MG PO TABS: 1.0000 | ORAL_TABLET | ORAL | Status: DC | PRN

## 2017-12-09 MED ORDER — ONDANSETRON HCL 4 MG/2ML IJ SOLN: 4.0000 mg | Freq: Four times a day (QID) | INTRAMUSCULAR | Status: DC | PRN

## 2017-12-09 MED ORDER — ACETAMINOPHEN 325 MG PO TABS
650.0000 mg | ORAL_TABLET | ORAL | Status: DC | PRN
  Administered 2017-12-10 (×2): 650 mg via ORAL
  Filled 2017-12-09 (×2): qty 2

## 2017-12-09 MED ORDER — SCOPOLAMINE 1 MG/3DAYS TD PT72
MEDICATED_PATCH | TRANSDERMAL | Status: AC
  Filled 2017-12-09: qty 1

## 2017-12-09 MED ORDER — COCONUT OIL OIL: 1.0000 "application " | TOPICAL_OIL | Status: DC | PRN

## 2017-12-09 MED ORDER — LIDOCAINE HCL (PF) 1 % IJ SOLN: 30.0000 mL | INTRAMUSCULAR | Status: DC | PRN

## 2017-12-09 MED ORDER — FENTANYL CITRATE (PF) 100 MCG/2ML IJ SOLN
25.0000 ug | INTRAMUSCULAR | Status: DC | PRN
  Administered 2017-12-09: 25 ug via INTRAVENOUS

## 2017-12-09 MED ORDER — OXYTOCIN 40 UNITS IN LACTATED RINGERS INFUSION - SIMPLE MED: 2.5000 [IU]/h | INTRAVENOUS | Status: DC

## 2017-12-09 MED ORDER — DIPHENHYDRAMINE HCL 25 MG PO CAPS: 25.0000 mg | ORAL_CAPSULE | Freq: Four times a day (QID) | ORAL | Status: DC | PRN

## 2017-12-09 MED ORDER — NALOXONE HCL 0.4 MG/ML IJ SOLN
1.0000 ug/kg/h | INTRAMUSCULAR | Status: DC | PRN
  Filled 2017-12-09: qty 5

## 2017-12-09 MED ORDER — OXYTOCIN 40 UNITS IN LACTATED RINGERS INFUSION - SIMPLE MED: 2.5000 [IU]/h | INTRAVENOUS | Status: AC

## 2017-12-09 MED ORDER — LACTATED RINGERS IV SOLN
INTRAVENOUS | Status: DC | PRN
  Administered 2017-12-09 (×2): via INTRAVENOUS

## 2017-12-09 MED ORDER — MORPHINE SULFATE (PF) 0.5 MG/ML IJ SOLN
INTRAMUSCULAR | Status: AC
  Filled 2017-12-09: qty 10

## 2017-12-09 MED ORDER — LABETALOL HCL 5 MG/ML IV SOLN: 20.0000 mg | INTRAVENOUS | Status: DC | PRN

## 2017-12-09 MED ORDER — ONDANSETRON HCL 4 MG/2ML IJ SOLN: 4.0000 mg | Freq: Three times a day (TID) | INTRAMUSCULAR | Status: DC | PRN

## 2017-12-09 MED ORDER — SENNOSIDES-DOCUSATE SODIUM 8.6-50 MG PO TABS
2.0000 | ORAL_TABLET | ORAL | Status: DC
  Administered 2017-12-10 – 2017-12-12 (×3): 2 via ORAL
  Filled 2017-12-09 (×3): qty 2

## 2017-12-09 MED ORDER — LACTATED RINGERS IV SOLN
INTRAVENOUS | Status: DC | PRN
  Administered 2017-12-09: 14:00:00 via INTRAVENOUS

## 2017-12-09 MED ORDER — ONDANSETRON HCL 4 MG/2ML IJ SOLN
INTRAMUSCULAR | Status: DC | PRN
  Administered 2017-12-09: 4 mg via INTRAVENOUS

## 2017-12-09 MED ORDER — MORPHINE SULFATE (PF) 0.5 MG/ML IJ SOLN
INTRAMUSCULAR | Status: DC | PRN
  Administered 2017-12-09: .2 mg via INTRATHECAL

## 2017-12-09 MED ORDER — NALBUPHINE HCL 10 MG/ML IJ SOLN
5.0000 mg | INTRAMUSCULAR | Status: DC | PRN
  Administered 2017-12-09: 5 mg via INTRAVENOUS
  Filled 2017-12-09: qty 1

## 2017-12-09 MED ORDER — OXYCODONE HCL 5 MG PO TABS: 5.0000 mg | ORAL_TABLET | Freq: Once | ORAL | Status: DC | PRN

## 2017-12-09 MED ORDER — OXYCODONE HCL 5 MG PO TABS
10.0000 mg | ORAL_TABLET | ORAL | Status: DC | PRN
  Administered 2017-12-10 – 2017-12-12 (×6): 10 mg via ORAL
  Filled 2017-12-09 (×6): qty 2

## 2017-12-09 MED ORDER — NALOXONE HCL 0.4 MG/ML IJ SOLN: 0.4000 mg | INTRAMUSCULAR | Status: DC | PRN

## 2017-12-09 MED ORDER — OXYCODONE-ACETAMINOPHEN 5-325 MG PO TABS: 2.0000 | ORAL_TABLET | ORAL | Status: DC | PRN

## 2017-12-09 MED ORDER — PRENATAL MULTIVITAMIN CH
1.0000 | ORAL_TABLET | Freq: Every day | ORAL | Status: DC
  Administered 2017-12-10 – 2017-12-11 (×2): 1 via ORAL
  Filled 2017-12-09 (×2): qty 1

## 2017-12-09 MED ORDER — OXYTOCIN 40 UNITS IN LACTATED RINGERS INFUSION - SIMPLE MED: 1.0000 m[IU]/min | INTRAVENOUS | Status: DC

## 2017-12-09 MED ORDER — OXYTOCIN BOLUS FROM INFUSION: 500.0000 mL | Freq: Once | INTRAVENOUS | Status: DC

## 2017-12-09 MED ORDER — ONDANSETRON HCL 4 MG/2ML IJ SOLN
INTRAMUSCULAR | Status: AC
  Filled 2017-12-09: qty 2

## 2017-12-09 MED ORDER — TERBUTALINE SULFATE 1 MG/ML IJ SOLN
0.2500 mg | Freq: Once | INTRAMUSCULAR | Status: DC | PRN
  Filled 2017-12-09: qty 1

## 2017-12-09 MED ORDER — PHENYLEPHRINE 8 MG IN D5W 100 ML (0.08MG/ML) PREMIX OPTIME
INJECTION | INTRAVENOUS | Status: AC
  Filled 2017-12-09: qty 100

## 2017-12-09 MED ORDER — IBUPROFEN 800 MG PO TABS
800.0000 mg | ORAL_TABLET | Freq: Three times a day (TID) | ORAL | Status: DC
  Administered 2017-12-10 – 2017-12-12 (×8): 800 mg via ORAL
  Filled 2017-12-09 (×8): qty 1

## 2017-12-09 MED ORDER — LACTATED RINGERS IV SOLN
500.0000 mL | INTRAVENOUS | Status: DC | PRN
  Administered 2017-12-09: 500 mL via INTRAVENOUS

## 2017-12-09 MED ORDER — SCOPOLAMINE 1 MG/3DAYS TD PT72
MEDICATED_PATCH | TRANSDERMAL | Status: DC | PRN
  Administered 2017-12-09: 1 via TRANSDERMAL

## 2017-12-09 MED ORDER — SODIUM CHLORIDE 0.9% FLUSH: 3.0000 mL | INTRAVENOUS | Status: DC | PRN

## 2017-12-09 MED ORDER — OXYTOCIN 10 UNIT/ML IJ SOLN
INTRAMUSCULAR | Status: AC
  Filled 2017-12-09: qty 4

## 2017-12-09 MED ORDER — ZOLPIDEM TARTRATE 5 MG PO TABS: 5.0000 mg | ORAL_TABLET | Freq: Every evening | ORAL | Status: DC | PRN

## 2017-12-09 MED ORDER — SOD CITRATE-CITRIC ACID 500-334 MG/5ML PO SOLN
30.0000 mL | ORAL | Status: DC | PRN
  Administered 2017-12-09: 30 mL via ORAL
  Filled 2017-12-09: qty 15

## 2017-12-09 MED ORDER — SCOPOLAMINE 1 MG/3DAYS TD PT72: 1.0000 | MEDICATED_PATCH | Freq: Once | TRANSDERMAL | Status: DC

## 2017-12-09 MED ORDER — FENTANYL CITRATE (PF) 100 MCG/2ML IJ SOLN
INTRAMUSCULAR | Status: AC
  Filled 2017-12-09: qty 2

## 2017-12-09 MED ORDER — DIBUCAINE 1 % RE OINT: 1.0000 "application " | TOPICAL_OINTMENT | RECTAL | Status: DC | PRN

## 2017-12-09 MED ORDER — DIPHENHYDRAMINE HCL 25 MG PO CAPS
25.0000 mg | ORAL_CAPSULE | ORAL | Status: DC | PRN
  Administered 2017-12-10: 25 mg via ORAL
  Filled 2017-12-09 (×2): qty 1

## 2017-12-09 MED ORDER — NALBUPHINE HCL 10 MG/ML IJ SOLN: 5.0000 mg | INTRAMUSCULAR | Status: DC | PRN

## 2017-12-09 MED ORDER — FENTANYL CITRATE (PF) 100 MCG/2ML IJ SOLN
INTRAMUSCULAR | Status: DC | PRN
  Administered 2017-12-09: 10 ug via INTRATHECAL

## 2017-12-09 MED ORDER — DEXTROSE 5 % IV SOLN
INTRAVENOUS | Status: DC | PRN
  Administered 2017-12-09: 3 g via INTRAVENOUS

## 2017-12-09 MED ORDER — PHENYLEPHRINE 8 MG IN D5W 100 ML (0.08MG/ML) PREMIX OPTIME
INJECTION | INTRAVENOUS | Status: DC | PRN
  Administered 2017-12-09: 60 ug/min via INTRAVENOUS

## 2017-12-09 MED ORDER — KETOROLAC TROMETHAMINE 30 MG/ML IJ SOLN
30.0000 mg | Freq: Four times a day (QID) | INTRAMUSCULAR | Status: AC | PRN
  Administered 2017-12-09: 30 mg via INTRAMUSCULAR

## 2017-12-09 MED ORDER — BUTORPHANOL TARTRATE 1 MG/ML IJ SOLN: 1.0000 mg | INTRAMUSCULAR | Status: DC | PRN

## 2017-12-09 MED ORDER — BUPIVACAINE IN DEXTROSE 0.75-8.25 % IT SOLN
INTRATHECAL | Status: DC | PRN
  Administered 2017-12-09: 1.7 mL via INTRATHECAL

## 2017-12-09 MED ORDER — LACTATED RINGERS IV SOLN
INTRAVENOUS | Status: DC | PRN
  Administered 2017-12-09: 40 [IU] via INTRAVENOUS

## 2017-12-09 MED ORDER — OXYCODONE HCL 5 MG PO TABS
5.0000 mg | ORAL_TABLET | ORAL | Status: DC | PRN
  Administered 2017-12-10: 5 mg via ORAL
  Filled 2017-12-09: qty 1

## 2017-12-09 MED ORDER — LACTATED RINGERS IV SOLN
INTRAVENOUS | Status: DC
  Administered 2017-12-09 – 2017-12-10 (×3): via INTRAVENOUS

## 2017-12-09 MED ORDER — WITCH HAZEL-GLYCERIN EX PADS: 1.0000 "application " | MEDICATED_PAD | CUTANEOUS | Status: DC | PRN

## 2017-12-09 MED ORDER — DIPHENHYDRAMINE HCL 50 MG/ML IJ SOLN: 12.5000 mg | INTRAMUSCULAR | Status: DC | PRN

## 2017-12-09 MED ORDER — LACTATED RINGERS IV SOLN
INTRAVENOUS | Status: DC
  Administered 2017-12-09: 11:00:00 via INTRAVENOUS

## 2017-12-09 MED ORDER — SIMETHICONE 80 MG PO CHEW
80.0000 mg | CHEWABLE_TABLET | ORAL | Status: DC | PRN
Start: 2017-12-09 — End: 2017-12-12

## 2017-12-09 SURGICAL SUPPLY — 36 items
APL SKNCLS STERI-STRIP NONHPOA (GAUZE/BANDAGES/DRESSINGS) ×1
BENZOIN TINCTURE PRP APPL 2/3 (GAUZE/BANDAGES/DRESSINGS) ×2 IMPLANT
CHLORAPREP W/TINT 26ML (MISCELLANEOUS) ×2 IMPLANT
CLAMP CORD UMBIL (MISCELLANEOUS) IMPLANT
CLOTH BEACON ORANGE TIMEOUT ST (SAFETY) ×2 IMPLANT
CONTAINER PREFILL 10% NBF 15ML (MISCELLANEOUS) IMPLANT
DRESSING DISP NPWT PICO 4X12 (MISCELLANEOUS) ×1 IMPLANT
DRSG OPSITE POSTOP 4X10 (GAUZE/BANDAGES/DRESSINGS) ×2 IMPLANT
ELECT REM PT RETURN 9FT ADLT (ELECTROSURGICAL) ×2
ELECTRODE REM PT RTRN 9FT ADLT (ELECTROSURGICAL) ×1 IMPLANT
EXTRACTOR VACUUM M CUP 4 TUBE (SUCTIONS) IMPLANT
GLOVE BIO SURGEON STRL SZ 6.5 (GLOVE) ×2 IMPLANT
GLOVE BIOGEL PI IND STRL 7.0 (GLOVE) ×1 IMPLANT
GLOVE BIOGEL PI INDICATOR 7.0 (GLOVE) ×1
GOWN STRL REUS W/TWL LRG LVL3 (GOWN DISPOSABLE) ×4 IMPLANT
KIT ABG SYR 3ML LUER SLIP (SYRINGE) IMPLANT
NDL HYPO 25X5/8 SAFETYGLIDE (NEEDLE) IMPLANT
NEEDLE HYPO 25X5/8 SAFETYGLIDE (NEEDLE) IMPLANT
NS IRRIG 1000ML POUR BTL (IV SOLUTION) ×2 IMPLANT
PACK C SECTION WH (CUSTOM PROCEDURE TRAY) ×2 IMPLANT
PAD OB MATERNITY 4.3X12.25 (PERSONAL CARE ITEMS) ×2 IMPLANT
PENCIL SMOKE EVAC W/HOLSTER (ELECTROSURGICAL) ×2 IMPLANT
RTRCTR C-SECT PINK 25CM LRG (MISCELLANEOUS) ×2 IMPLANT
STRIP CLOSURE SKIN 1/2X4 (GAUZE/BANDAGES/DRESSINGS) ×2 IMPLANT
SUT MNCRL 0 VIOLET CTX 36 (SUTURE) ×2 IMPLANT
SUT MONOCRYL 0 CTX 36 (SUTURE) ×2
SUT PLAIN 1 NONE 54 (SUTURE) IMPLANT
SUT PLAIN 2 0 XLH (SUTURE) ×2 IMPLANT
SUT VIC AB 0 CT1 27 (SUTURE) ×4
SUT VIC AB 0 CT1 27XBRD ANBCTR (SUTURE) ×2 IMPLANT
SUT VIC AB 2-0 CT1 27 (SUTURE) ×2
SUT VIC AB 2-0 CT1 TAPERPNT 27 (SUTURE) ×1 IMPLANT
SUT VIC AB 4-0 KS 27 (SUTURE) ×2 IMPLANT
SYR BULB IRRIGATION 50ML (SYRINGE) ×2 IMPLANT
TOWEL OR 17X24 6PK STRL BLUE (TOWEL DISPOSABLE) ×2 IMPLANT
TRAY FOLEY BAG SILVER LF 14FR (SET/KITS/TRAYS/PACK) ×2 IMPLANT

## 2017-12-09 NOTE — Progress Notes (Signed)
Patient ID: Sandra Logan, female   DOB: 04-08-1990, 27 y.o.   MRN: [id number]  Feeling some back pain/ctx  +FM, no LOF, no VB  AF VSS (138/80's) gen NAD FHTs 140-145, mod var, late decels, category 2 toco Q 4min  SVE 4/50/-2, vtx AROM for mod meconium - d/w pt  27yo G1P0 at 39wk with cevical change - early labor Close monitoring

## 2017-12-09 NOTE — H&P (Signed)
Rosalie GumsJacura D Vasco is a 27 y.o. G1P0 at 39+ with elevated BP, contractions and cervical change.  Admit for augmentation of labor.  Pt called with some bloody show and intermittent back pain.  Contractions.  Pt stated with decreased/no fetal movement for 1 day.  Pregnancy complicated by morbid obesity and LGA baby.  [facility]  Pregnancy dated by LMP cw early US.  Last US - MFM baby > 90% growth.  Occ lates in MAU  OB History    Gravida Para Term Preterm AB Living   1             SAB TAB Ectopic Multiple Live Births                G1 present - fetal pyelectasis, LGA, maternal obesity  No abnormal pap No STD  Past Medical History:  Diagnosis Date  . Anemia   . Asthma   . Environmental allergies   . IBS (irritable bowel syndrome)   . Trichomonas infection   h/o migraines  Past Surgical History:  Procedure Laterality Date  . NO PAST SURGERIES     Family History: DM, HTN Social History:  reports that  has never smoked. she has never used smokeless tobacco. She reports that she does not drink alcohol or use drugs. single, customer service  Meds Protonix, PNV, Proventil All NKDA     Maternal Diabetes: No Genetic Screening: Normal Maternal Ultrasounds/Referrals: Normal Fetal Ultrasounds or other Referrals:  None, Referred to Materal Fetal Medicine fetal pyelectasis and LGA Maternal Substance Abuse:  No Significant Maternal Medications:  None Significant Maternal Lab Results:  Lab values include: Group B Strep negative Other Comments:  None  Review of Systems  Constitutional: Negative.   HENT: Negative.   Eyes: Negative.   Respiratory: Negative.   Cardiovascular: Negative.   Gastrointestinal: Negative.   Genitourinary: Negative.   Musculoskeletal: Negative.   Skin: Negative.   Neurological: Negative.   Psychiatric/Behavioral: Negative.    Maternal Medical History:  Reason for admission: Contractions.  PIH  Contractions: Frequency:  irregular.    Fetal activity: Perceived fetal activity is normal.    Prenatal complications: PIH.   Morbidly obese  Prenatal Complications - Diabetes: none.    Dilation: 3 Effacement (%): 50 Exam by:: Dr. Nira Retortegele Blood pressure (!) 160/101, pulse (!) 101, weight (!) 186.1 kg (410 lb 4 oz), last menstrual period 03/10/2017, SpO2 100 %. Maternal Exam:  Uterine Assessment: Contraction strength is moderate.  Contraction frequency is regular.   Abdomen: Patient reports no abdominal tenderness. Estimated fetal weight is 8-9#.   Fetal presentation: vertex  Introitus: Normal vulva. Normal vagina.    Physical Exam  Constitutional: She is oriented to person, place, and time. She appears well-developed and well-nourished.  Morbidly obese  HENT:  Head: Normocephalic and atraumatic.  Cardiovascular: Normal rate and regular rhythm.  Respiratory: Effort normal and breath sounds normal. No respiratory distress. She has no wheezes.  GI: Soft. Bowel sounds are normal. She exhibits no distension. There is no tenderness.  Musculoskeletal: Normal range of motion.  Neurological: She is alert and oriented to person, place, and time.  Skin: Skin is warm and dry.  Psychiatric: She has a normal mood and affect. Her behavior is normal.    Prenatal labs: ABO, Rh: B/Positive/-- (07/05 0000) Antibody: Negative (07/05 0000) Rubella:  immune RPR:   NR HBsAg:   neg HIV:   neg GBS:   neg  Hgb 11.9/Plt 298/Ur Cx  neg/GC neg/Chl neg/Hgb electro WNL/glucola 107/AFP WNL  Essential panel declined Ant plac, limited anat - f/u with MFM - fetal pyelectasis, female   Assessment/Plan: 27yo G1P0 at 39+ in early labor - cervical change, PIH, occ lates gbbs neg Augment with pitocin Epidural, nitrous, or stadol prn [facility] - check labs Labetalol protocol  Cecil Bixby Bovard-Stuckert 12/09/2017, 11:32 AM

## 2017-12-09 NOTE — Transfer of Care (Signed)
Immediate Anesthesia Transfer of Care Note  Patient: Sandra Logan  Procedure(s) Performed: CESAREAN SECTION (N/A )  Patient Location: PACU  Anesthesia Type:Spinal  Level of Consciousness: awake, alert  and oriented  Airway & Oxygen Therapy: Patient Spontanous Breathing  Post-op Assessment: Report given to RN and Post -op Vital signs reviewed and stable  Post vital signs: Reviewed and stable  Last Vitals:  Vitals:   12/09/17 1137 12/09/17 1202  BP: 132/76 138/83  Pulse: 80 78  Resp: 20   Temp: 36.8 C   SpO2:      Last Pain:  Vitals:   12/09/17 1219  TempSrc:   PainSc: 6          Complications: No apparent anesthesia complications

## 2017-12-09 NOTE — Anesthesia Preprocedure Evaluation (Signed)
Anesthesia Evaluation  Patient identified by MRN, date of birth, ID band Patient awake    Reviewed: Allergy & Precautions, H&P , NPO status , Patient's Chart, lab work & pertinent test resultsPreop documentation limited or incomplete due to emergent nature of procedure.  Airway Mallampati: III   Neck ROM: full    Dental   Pulmonary asthma ,    breath sounds clear to auscultation       Cardiovascular hypertension,  Rhythm:regular Rate:Normal     Neuro/Psych    GI/Hepatic   Endo/Other    Renal/GU      Musculoskeletal   Abdominal   Peds  Hematology  (+) anemia ,   Anesthesia Other Findings   Reproductive/Obstetrics (+) Pregnancy                             Anesthesia Physical Anesthesia Plan  ASA: II and emergent  Anesthesia Plan: Spinal   Post-op Pain Management:    Induction: Intravenous  PONV Risk Score and Plan: 2 and Ondansetron, Scopolamine patch - Pre-op and Treatment may vary due to age or medical condition  Airway Management Planned: Simple Face Mask  Additional Equipment:   Intra-op Plan:   Post-operative Plan:   Informed Consent: I have reviewed the patients History and Physical, chart, labs and discussed the procedure including the risks, benefits and alternatives for the proposed anesthesia with the patient or authorized representative who has indicated his/her understanding and acceptance.     Plan Discussed with: CRNA, Anesthesiologist and Surgeon  Anesthesia Plan Comments:         Anesthesia Quick Evaluation

## 2017-12-09 NOTE — Brief Op Note (Signed)
12/09/2017  3:03 PM  PATIENT:  Sandra Logan  27 y.o. female  PRE-OPERATIVE DIAGNOSIS:  primary cesarean section repetitive late decelerations  POST-OPERATIVE DIAGNOSIS:  primary cesarean section repetitive late decelerations  PROCEDURE:  Procedure(s): CESAREAN SECTION (N/A)   FINDINGS: viable female infant at 13:46, apgars 8/9; weight P; cord gas = 7.23.  SURGEON:  Surgeon(s) and Role:    * Bovard-Stuckert, Basheer Molchan, MD - Primary  ASSISTANTS: Medical laboratory scientific officerKrietemeyer, Heather RNFA   ANESTHESIA:   spinal  EBL:  914 mL   BLOOD ADMINISTERED:none  DRAINS: Urinary Catheter (Foley)   LOCAL MEDICATIONS USED:  NONE  SPECIMEN:  Source of Specimen:  [place]  DISPOSITION OF SPECIMEN:  L&D  COUNTS:  YES  TOURNIQUET:  * No tourniquets in log *  DICTATION: .Other Dictation: Dictation Number [id number]  PLAN OF CARE: Admit to inpatient   PATIENT DISPOSITION:  PACU - hemodynamically stable.   Delay start of Pharmacological VTE agent (>24hrs) due to surgical blood loss or risk of bleeding: not applicable

## 2017-12-09 NOTE — Progress Notes (Signed)
Patient ID: Sandra Logan, female   DOB: Jun 17, 1990, 27 y.o.   MRN: [id number]   Pt with repetitive late decels.  D/w pt r/b/a of LTCS.  Including bleeding, infection, damage to surrounding organs.  Questions answered.  Will proceed.
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
## 2017-12-09 NOTE — Anesthesia Procedure Notes (Signed)
Spinal  Patient location during procedure: OR Start time: 12/09/2017 1:25 PM End time: 12/09/2017 1:30 PM Staffing Anesthesiologist: Achille RichHodierne, Mithra Spano, MD Performed: anesthesiologist  Preanesthetic Checklist Completed: patient identified, surgical consent, pre-op evaluation, timeout performed, IV checked, risks and benefits discussed and monitors and equipment checked Spinal Block Patient position: sitting Prep: DuraPrep Patient monitoring: cardiac monitor, continuous pulse ox and blood pressure Approach: midline Location: L3-4 Injection technique: single-shot Needle Needle type: Pencan  Needle gauge: 24 G Needle length: 12.7 cm Assessment Sensory level: T10 Additional Notes Functioning IV was confirmed and monitors were applied. Sterile prep and drape, including hand hygiene and sterile gloves were used. The patient was positioned and the spine was prepped. The skin was anesthetized with lidocaine.  Free flow of clear CSF was obtained prior to injecting local anesthetic into the CSF.  The spinal needle aspirated freely following injection.  The needle was carefully withdrawn.  The patient tolerated the procedure well.

## 2017-12-09 NOTE — MAU Provider Note (Signed)
History  CSN: [id number] Arrival date and time: 12/09/17 1007  First Provider Initiated Contact with Patient 12/09/17 1102      Chief Complaint  Patient presents with  . Back Pain  . Labor Eval  . Decreased Fetal Movement    HPI: Sandra Logan is a 27 y.o. G1P0 with IUP at 3924w1d who presents to maternity admissions reporting decreased fetal movement, and back pain. She report not feeling baby move all day yesterday. Also feeling intermittent back pain since late yesterday, now pain is more constant. Also noticed a brownish discharge this morning. No large gush of fluid. Reports some ankle swelling, but denies headache, blurry vision, RUQ/epigastric pain, dizziness/lighreadhess, or SOB.  [facility]   OB History  Gravida Para Term Preterm AB Living  1            SAB TAB Ectopic Multiple Live Births               # Outcome Date GA Lbr Len/2nd Weight Sex Delivery Anes PTL Lv  1 Current              Past Medical History:  Diagnosis Date  . Anemia   . Asthma   . Environmental allergies   . IBS (irritable bowel syndrome)   . Trichomonas infection    Past Surgical History:  Procedure Laterality Date  . NO PAST SURGERIES     History reviewed. No pertinent family history. Social History   Socioeconomic History  . Marital status: Single    Spouse name: Not on file  . Number of children: Not on file  . Years of education: Not on file  . Highest education level: Not on file  Social Needs  . Financial resource strain: Not on file  . Food insecurity - worry: Not on file  . Food insecurity - inability: Not on file  . Transportation needs - medical: Not on file  . Transportation needs - non-medical: Not on file  Occupational History  . Not on file  Tobacco Use  . Smoking status: Never Smoker  . Smokeless tobacco: Never Used  Substance and Sexual Activity  . Alcohol use: No  . Drug use: No  . Sexual activity: Yes    Comment: 1 sex partner in  last 12 months  Other Topics Concern  . Not on file  Social History Narrative  . Not on file   No Known Allergies  Medications Prior to Admission  Medication Sig Dispense Refill Last Dose  . cephALEXin (KEFLEX) 500 MG capsule Take 1 capsule (500 mg total) by mouth 3 (three) times daily. (Patient not taking: Reported on 07/29/2017) 21 capsule 0 Not Taking  . famotidine (PEPCID AC) 10 MG chewable tablet Chew 10 mg by mouth 2 (two) times daily.   Taking  . HYDROcodone-acetaminophen (NORCO/VICODIN) 5-325 MG per tablet Take 1 tablet by mouth every 4 (four) hours as needed for moderate pain. (Patient not taking: Reported on 07/29/2017) 15 tablet 0 Not Taking  . ondansetron (ZOFRAN ODT) 8 MG disintegrating tablet Take 1 tablet (8 mg total) by mouth every 8 (eight) hours as needed for nausea or vomiting. (Patient not taking: Reported on 07/29/2017) 12 tablet 0 Not Taking  . Prenatal Multivit-Min-Fe-FA (PRENATAL VITAMINS PO) Take by mouth.   Taking    I have reviewed patient's Past Medical Hx, Surgical Hx, Family Hx, Social Hx, medications and allergies.   Review of Systems: Negative except for what is mentioned in HPI.  Physical Exam   Patient Vitals for the past 4 hrs:  BP Pulse SpO2 Weight  12/09/17 1059 (!) 160/101 (!) 101 100 % -  12/09/17 1030 (!) 150/94 80 100 % -  12/09/17 1025 (!) 144/100 86 100 % (!) 410 lb 4 oz (186.1 kg)   Constitutional: Well-developed, well-nourished female in no acute distress.  HENT: [place]/AT, normal oropharynx mucosa. MMM Eyes: normal conjunctivae, no scleral icterus Cardiovascular: increased rate, regular rhythm Respiratory: normal effort, lungs CTAB.  GI: Abd soft, non-tender, morbidly obese Pelvic: NEFG. Normal vaginal mucosa without lesions. Cervix pink, no pooling, bloody show SVE: 2-3 cm/50%/bollatoble, cephalic  MSK: Extremities nontender, trace edema Neurologic: Alert and oriented x 4. Psych: Normal mood and affect Skin: warm and dry   FHT:   Baseline 150 , moderate variability, no accelerations , late decelerations Toco: Contractions: q 2-5 mins  MAU Course/MDM:   Nursing notes and VS reviewed. Patient seen and examined, as noted above.  FHT with subtle late decels. BP elevated.  PIH labs ordered.  Called Dr. Ellyn HackBovard, will admit patient.  Assessment and Plan  Assessment: 1. Pregnancy-induced hypertension in third trimester     Plan: --Admit to L&D.   Blondell Laperle, Kandra NicolasJulie P, MD 12/09/2017 11:23 AM

## 2017-12-10 ENCOUNTER — Encounter (HOSPITAL_COMMUNITY): Payer: Self-pay | Admitting: Obstetrics and Gynecology

## 2017-12-10 LAB — CBC
HEMATOCRIT: 27.6 % — AB (ref 36.0–46.0)
HEMOGLOBIN: 8.9 g/dL — AB (ref 12.0–15.0)
MCH: 24.8 pg — ABNORMAL LOW (ref 26.0–34.0)
MCHC: 32.2 g/dL (ref 30.0–36.0)
MCV: 76.9 fL — ABNORMAL LOW (ref 78.0–100.0)
Platelets: 289 10*3/uL (ref 150–400)
RBC: 3.59 MIL/uL — ABNORMAL LOW (ref 3.87–5.11)
RDW: 14.4 % (ref 11.5–15.5)
WBC: 12.5 10*3/uL — AB (ref 4.0–10.5)

## 2017-12-10 LAB — RPR: RPR Ser Ql: NONREACTIVE

## 2017-12-10 MED ORDER — PANTOPRAZOLE SODIUM 40 MG PO TBEC
40.0000 mg | DELAYED_RELEASE_TABLET | Freq: Every day | ORAL | Status: DC
  Administered 2017-12-10: 40 mg via ORAL
  Filled 2017-12-10 (×2): qty 1

## 2017-12-10 MED ORDER — LACTATED RINGERS IV BOLUS (SEPSIS): 500.0000 mL | Freq: Once | INTRAVENOUS | Status: DC

## 2017-12-10 NOTE — Anesthesia Postprocedure Evaluation (Signed)
Anesthesia Post Note  Patient: Danton SewerJacura D Ellingwood  Procedure(s) Performed: CESAREAN SECTION (N/A )     Patient location during evaluation: Mother Baby Anesthesia Type: Spinal Level of consciousness: awake Pain management: pain level controlled Vital Signs Assessment: post-procedure vital signs reviewed and stable Respiratory status: spontaneous breathing Cardiovascular status: stable Postop Assessment: no headache, spinal receding and patient able to bend at knees Anesthetic complications: no    Last Vitals:  Vitals:   12/10/17 0546 12/10/17 0757  BP:  (!) 102/53  Pulse:  76  Resp:  18  Temp:  (!) 36.3 C  SpO2: 98% 98%    Last Pain:  Vitals:   12/10/17 0757  TempSrc: Oral  PainSc:    Pain Goal: Patients Stated Pain Goal: 4 (12/09/17 1831)               Edison PaceWILKERSON,Kalenna Millett

## 2017-12-10 NOTE — Lactation Note (Signed)
This note was copied from a baby's chart. Lactation Consultation Note: Mother has been attempting to breastfeed. Mother has large breast and large nipples. Staff nurse assist with latching infant and reports that infant unable to get entire nipple in his mouth. Discussed pumping and bottle feeding. . Mother reports that her insurance company is providing an electric pump. Mother also ask for a hand pump. Mother was given a harmony hand pump and fit with a # 30-36 mm flange.Mother bottle fed infant formula and infant took 10 ml. Mother was sat up with a DEBP by staff member. Mother reports that she thinks that she will just formula feed infant. [facility]. Mother is active with WIC. [facility] with all available community support services.   Patient Name: Boy Army FossaJacura Cwynar ZOXWR'UToday's Date: 12/10/2017 Reason for consult: Initial assessment   Maternal Data    Feeding Feeding Type: (mother reports that she switched to formula feeding)  LATCH Score                   Interventions    Lactation Tools Discussed/Used     Consult Status Consult Status: Complete    Michel BickersKendrick, Vivia [place] McCoy 12/10/2017, 2:05 PM

## 2017-12-10 NOTE — Progress Notes (Signed)
Patient ID: Sandra Logan, female   DOB: 04/09/1990, 27 y.o.   MRN: [id number] UOP 300ml at 1300 and another 300ml at 1700 = adequate

## 2017-12-10 NOTE — Op Note (Signed)
NAMEstelle Grumbles:  Valera, Sandra Logan           ACCOUNT NO.:  [id number]  MEDICAL RECORD NO.:  [id number]  LOCATION:  [facility]                        FACILITY:  [facility]  PHYSICIAN:  Sherron MondayJody Bovard, MD        DATE OF BIRTH:  05/12/1990  DATE OF PROCEDURE:  12/09/2017 DATE OF DISCHARGE:                              OPERATIVE REPORT   PREOPERATIVE DIAGNOSES:  Intrauterine pregnancy at term with repetitive late decelerations, primary low-transverse cesarean section.  POSTOPERATIVE DIAGNOSES:  Intrauterine pregnancy at term with repetitive late decelerations, primary low-transverse cesarean section, delivered.  PROCEDURE:  Low transverse cesarean section.  SURGEON:  Sherron MondayJody Bovard, MD.  ASSISTANJonette Mate:  Heather Krietetmeyer, RNFA.  ANESTHESIA:  Spinal.  IV FLUIDS AND URINE OUTPUT:  Per anesthesia.  ESTIMATED BLOOD LOSS:  Approximately 914 mL.  FINDINGS:  Viable female infant at [id number] with Apgars of 8 at one minute, 9 at five minutes, and weight pending at the time of dictation.  Cord gases noted to be 7.23.  COMPLICATIONS:  Small laceration on the baby's right side of forehead.  PATHOLOGY:  Placenta to L and D.  DESCRIPTION OF PROCEDURE:  After informed consent was reviewed with the patient and her family, discussing with repetitive late decelerations, she was transferred to the OR, where spinal anesthesia was placed and found to be adequate.  She was then returned to the supine position with a leftward tilt and prep was applied.  A Foley catheter was sterilely placed.  She was prepped and draped in the normal sterile fashion.  An appropriate time-out was performed.  A Pfannenstiel incision was made at the level approximately 2 fingerbreadths above the pubic symphysis, carried through the underlying layer of fascia sharply. The fascia was incised in the midline and the midline incision was extended laterally with Mayo scissors.  The superior aspect of the fascial incision was grasped with Kocher clamps,  elevated, and the rectus muscles were dissected off both bluntly and sharply.  The midline was easily identified and the peritoneum was entered bluntly.  The Alexis skin retractor was placed carefully, making sure that no bowel was entrapped.  The uterus was explored and incised in transverse fashion.  The incision was extended and the infant was delivered from a vertex presentation.  At the time of delivery, it was noted that there was a small laceration approximately 2 cm on the infant's right side of forehead.  This was shown to pediatricians.  Infant was delivered from the vertex presentation with no difficulty.  Nose and mouth were suctioned on the field.  Infant was awaited a minute before cutting the cord.  The infant was then handed off to the awaiting Pediatric staff. The placenta was expressed from the uterus and the uterus was cleared of all clots and debris.  Uterine incision was closed in 2 layers of 0 Monocryl, the first of which was running locked and the second was an imbricating layer.  It was noted to be hemostatic.  The gutters were cleared of all clot and debris.  The Alexis skin retractor was removed and the peritoneum was reapproximated with 2-0 Vicryl in a running fashion.  The fascia was reapproximated from either end with 0 Vicryl, overlapping in  the midline.  Subcuticular adipose layer was made hemostatic with Bovie cautery and the dead space was closed with 3-0 plain gut.  The skin was closed with 4-0 Vicryl in a subcu fashion. Following this, Benzoin and Steri-Strips were applied.  Then, the PICO dressing was also applied.  The patient tolerated the procedure well. Sponge, lap, and needle counts were correct x2 per the operating staff.     Sherron MondayJody Bovard, MD     JB/MEDQ  D:  12/09/2017  T:  12/10/2017  Job:  [id number]

## 2017-12-10 NOTE — Progress Notes (Signed)
Patient ID: Sandra Logan, female   DOB: 12-05-1990, 27 y.o.   MRN: [id number] Per nursing still waiting on baby to void Will check at end of day to see if circ can be done then

## 2017-12-10 NOTE — Progress Notes (Addendum)
Rn called Dr . Hinton RaoBovard-Stuckert regarding patient's decreased output-over 8 hours (200mlS).  Dr Hinton RaoBovard-Stuckert ordered a 500ml bolus of lactated ringers.

## 2017-12-10 NOTE — Progress Notes (Signed)
Subjective: Postpartum Day 1: Cesarean Delivery Patient reports incisional pain, tolerating PO and + flatus.  UOP slightly improved after fluid bolus. Denies fever, chills, SOP, CP or HA. Pumping and bonding well with baby.   Objective: Vital signs in last 24 hours: Temp:  [96.8 F (36 C)-98.3 F (36.8 C)] 97.3 F (36.3 C) (12/11 0757) Pulse Rate:  [55-101] 76 (12/11 0757) Resp:  [13-22] 18 (12/11 0757) BP: (90-160)/(27-101) 102/53 (12/11 0757) SpO2:  [96 %-100 %] 98 % (12/11 0757) Weight:  [410 lb (186 kg)-410 lb 4 oz (186.1 kg)] 410 lb (186 kg) (12/10 1137)  Physical Exam:  General: alert, cooperative and no distress Lochia: appropriate Uterine Fundus: firm Incision: dressing c/d/i DVT Evaluation: No evidence of DVT seen on physical exam.  Recent Labs    12/09/17 1120 12/10/17 0557  HGB 10.7* 8.9*  HCT 34.0* 27.6*    Assessment/Plan: Status post Cesarean section. Doing well postoperatively.  Continue current care. Ok to remove foley - monitor urine output; encourage hydration Likely circumcision of baby later today  Cathrine MusterCecilia W Banga 12/10/2017, 9:36 AM

## 2017-12-11 NOTE — Progress Notes (Signed)
Subjective: Postpartum Day 2 Cesarean Delivery Patient reports tolerating PO, + flatus and no problems voiding.  Ambulated to bathroom only  Objective: Vital signs in last 24 hours: Temp:  [97.7 F (36.5 C)-98.6 F (37 C)] 97.7 F (36.5 C) (12/12 0507) Pulse Rate:  [65-79] 76 (12/12 0507) Resp:  [18-20] 18 (12/12 0507) BP: (107-130)/(52-69) 123/62 (12/12 0507) SpO2:  [97 %-100 %] 100 % (12/11 1702)  Physical Exam:  General: alert and cooperative, sleepy Lochia: appropriate Uterine Fundus: firm Incision: wound vac dressing in place   Recent Labs    12/09/17 1120 12/10/17 0557  HGB 10.7* 8.9*  HCT 34.0* 27.6*    Assessment/Plan: Status post Cesarean section. Doing well postoperatively.   Increase ambulation and shower today. Plan d/c tomorrow  Oliver PilaKathy W Marianny Goris 12/11/2017, 8:53 AM

## 2017-12-12 ENCOUNTER — Encounter (HOSPITAL_COMMUNITY): Payer: Self-pay | Admitting: *Deleted

## 2017-12-12 MED ORDER — IBUPROFEN 800 MG PO TABS: 800.0000 mg | ORAL_TABLET | Freq: Three times a day (TID) | ORAL | 0 refills | Status: AC

## 2017-12-12 MED ORDER — OXYCODONE HCL 5 MG PO TABS: 5.0000 mg | ORAL_TABLET | ORAL | 0 refills | Status: DC | PRN

## 2017-12-12 NOTE — Discharge Instructions (Signed)
As per discharge pamphlet °

## 2017-12-12 NOTE — Progress Notes (Signed)
CSW received consult for hx of Anxiety and Depression.  CSW met with MOB to offer support and complete assessment.    LCSW met with MOB who scored a 12 on the Edinburgh Postnatal Scale.  LCSW met with MOB and family in room.  As discussed below, no concerns at time of visit.  Positive support with family in room.  Discussed medicaid and WIC with MOB.  MOB reports she will have some time from work and school to bond with the baby.  No other questions or concerns noted.  CSW provided education regarding the baby blues period vs. perinatal mood disorders, discussed treatment and gave resources for mental health follow up if concerns arise.  CSW recommends self-evaluation during the postpartum time period using the New Mom Checklist from Postpartum Progress and encouraged MOB to contact a medical professional if symptoms are noted at any time.   CSW provided review of Sudden Infant Death Syndrome (SIDS) precautions.   CSW identifies no further need for intervention and no barriers to discharge at this time.  Ellenor Wisniewski LCSW, MSW Clinical Social Work: [facility] [employer] 

## 2017-12-12 NOTE — Progress Notes (Signed)
POD #3 Doing ok, sore Afeb, VSS Abd- soft, fundus firm, dressing intact D/c home

## 2017-12-12 NOTE — Discharge Summary (Signed)
OB Discharge Summary     Patient Name: Sandra Logan D ZOXWRUEAVWSturdivant DOB: 1990/09/11 MRN: [id number]  Date of admission: 12/09/2017 Delivering MD: Sherian ReinBOVARD-STUCKERT, JODY   Date of discharge: 12/12/2017  Admitting diagnosis: 39 WKS, BACK PAIN, LABOR Intrauterine pregnancy: 5736w4d     Secondary diagnosis:  Principal Problem:   S/P cesarean section Active Problems:   PIH (pregnancy induced hypertension)      Discharge diagnosis: Term Pregnancy Delivered, Gestational Hypertension and FHR decels                                                                                               Hospital course:  Onset of Labor With Unplanned C/S  27 y.o. yo G1P0 at 6236w4d was admitted in Latent Labor on 12/09/2017. Patient had a labor course significant for repetitive late decels. Membrane Rupture Time/Date: 12:19 PM ,12/09/2017   The patient went for cesarean section due to FHR decels, and delivered a Viable infant,12/09/2017  Details of operation can be found in separate operative note. Patient had an uncomplicated postpartum course.  She is ambulating,tolerating a regular diet, passing flatus, and urinating well.  Patient is discharged home in stable condition 12/12/17.  Physical exam  Vitals:   12/10/17 1702 12/11/17 0507 12/11/17 1940 12/12/17 0625  BP: (!) 130/52 123/62 122/71 125/77  Pulse: 79 76 83 76  Resp: 20 18 18 16   Temp: 98.2 F (36.8 C) 97.7 F (36.5 C) 97.7 F (36.5 C) 97.9 F (36.6 C)  TempSrc: Oral Oral Oral Oral  SpO2: 100%     Weight:      Height:       General: alert Lochia: appropriate Uterine Fundus: firm Incision: Dressing is clean, dry, and intact  Labs: Lab Results  Component Value Date   WBC 12.5 (H) 12/10/2017   HGB 8.9 (L) 12/10/2017   HCT 27.6 (L) 12/10/2017   MCV 76.9 (L) 12/10/2017   PLT 289 12/10/2017   CMP Latest Ref Rng & Units 12/09/2017  Glucose 65 - 99 mg/dL 74  BUN 6 - 20 mg/dL 9  Creatinine 8.290.44 - 5.621.00 mg/dL 1.300.72  Sodium 865135 - 784145 mmol/L  137  Potassium 3.5 - 5.1 mmol/L 4.2  Chloride 101 - 111 mmol/L 108  CO2 22 - 32 mmol/L 17(L)  Calcium 8.9 - 10.3 mg/dL 9.2  Total Protein 6.5 - 8.1 g/dL 7.0  Total Bilirubin 0.3 - 1.2 mg/dL 0.3  Alkaline Phos 38 - 126 U/L 114  AST 15 - 41 U/L 22  ALT 14 - 54 U/L 15    Discharge instruction: per After Visit Summary and "Baby and Me Booklet".  After visit meds:  Allergies as of 12/12/2017   No Known Allergies     Medication List    TAKE these medications   famotidine 10 MG chewable tablet Commonly known as:  PEPCID AC Chew 10 mg by mouth 2 (two) times daily.   ibuprofen 800 MG tablet Commonly known as:  ADVIL,MOTRIN Take 1 tablet (800 mg total) by mouth every 8 (eight) hours.   oxyCODONE 5 MG immediate release tablet Commonly known as:  Oxy  IR/ROXICODONE Take 1 tablet (5 mg total) by mouth every 4 (four) hours as needed for severe pain.   PRENATAL VITAMINS PO Take by mouth.       Diet: routine diet  Activity: Advance as tolerated. Pelvic rest for 6 weeks.   Outpatient follow up:2 weeks   Newborn Data: Live born female  Birth Weight: 8 lb 14 oz (4025 g) APGAR: 8, 9  Newborn Delivery   Birth date/time:  12/09/2017 13:46:00 Delivery type:  C-Section, Low Transverse C-section categorization:  Primary     Baby Feeding: Breast Disposition:home with mother   12/12/2017 Zenaida Nieceodd D Keegan Ducey, MD

## 2017-12-13 ENCOUNTER — Inpatient Hospital Stay (HOSPITAL_COMMUNITY): Admission: RE | Admit: 2017-12-13 | Payer: BLUE CROSS/BLUE SHIELD | Source: Ambulatory Visit

## 2018-01-02 ENCOUNTER — Encounter (HOSPITAL_COMMUNITY): Payer: Self-pay | Admitting: *Deleted

## 2018-01-02 ENCOUNTER — Emergency Department (HOSPITAL_COMMUNITY)
Admission: EM | Admit: 2018-01-02 | Discharge: 2018-01-02 | Disposition: A | Discharge status: Home / Self Care | Payer: BLUE CROSS/BLUE SHIELD | Attending: Emergency Medicine | Admitting: Emergency Medicine

## 2018-01-02 ENCOUNTER — Other Ambulatory Visit: Payer: Self-pay

## 2018-01-02 DIAGNOSIS — Y999 Unspecified external cause status: Secondary | ICD-10-CM | POA: Diagnosis not present

## 2018-01-02 DIAGNOSIS — J45909 Unspecified asthma, uncomplicated: Secondary | ICD-10-CM | POA: Insufficient documentation

## 2018-01-02 DIAGNOSIS — T148XXA Other injury of unspecified body region, initial encounter: Secondary | ICD-10-CM | POA: Diagnosis not present

## 2018-01-02 DIAGNOSIS — R1030 Lower abdominal pain, unspecified: Secondary | ICD-10-CM | POA: Insufficient documentation

## 2018-01-02 DIAGNOSIS — R51 Headache: Secondary | ICD-10-CM | POA: Diagnosis not present

## 2018-01-02 DIAGNOSIS — S0990XA Unspecified injury of head, initial encounter: Secondary | ICD-10-CM | POA: Diagnosis not present

## 2018-01-02 DIAGNOSIS — M542 Cervicalgia: Secondary | ICD-10-CM | POA: Diagnosis not present

## 2018-01-02 DIAGNOSIS — Y9389 Activity, other specified: Secondary | ICD-10-CM | POA: Insufficient documentation

## 2018-01-02 DIAGNOSIS — Y92410 Unspecified street and highway as the place of occurrence of the external cause: Secondary | ICD-10-CM | POA: Diagnosis not present
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
## 2018-01-02 NOTE — Discharge Instructions (Signed)
Please read attached information. If you experience any new or worsening signs or symptoms please return to the emergency room for evaluation. Please follow-up with your primary care provider or specialist as discussed. Please use medication prescribed only as directed and discontinue taking if you have any concerning signs or symptoms.   °

## 2018-01-24 DIAGNOSIS — Z1151 Encounter for screening for human papillomavirus (HPV): Secondary | ICD-10-CM | POA: Diagnosis not present

## 2018-01-24 DIAGNOSIS — Z124 Encounter for screening for malignant neoplasm of cervix: Secondary | ICD-10-CM | POA: Diagnosis not present

## 2018-01-24 DIAGNOSIS — Z3009 Encounter for other general counseling and advice on contraception: Secondary | ICD-10-CM | POA: Diagnosis not present

## 2018-05-09 ENCOUNTER — Ambulatory Visit: Payer: BLUE CROSS/BLUE SHIELD | Admitting: Family Medicine

## 2018-05-12 ENCOUNTER — Ambulatory Visit: Payer: BLUE CROSS/BLUE SHIELD | Admitting: Family Medicine

## 2018-06-16 ENCOUNTER — Ambulatory Visit (INDEPENDENT_AMBULATORY_CARE_PROVIDER_SITE_OTHER): Payer: BLUE CROSS/BLUE SHIELD | Admitting: Family Medicine

## 2018-06-16 ENCOUNTER — Ambulatory Visit (INDEPENDENT_AMBULATORY_CARE_PROVIDER_SITE_OTHER)
Admission: RE | Admit: 2018-06-16 | Discharge: 2018-06-16 | Disposition: A | Discharge status: Home / Self Care | Payer: BLUE CROSS/BLUE SHIELD | Source: Ambulatory Visit | Attending: Family Medicine | Admitting: Family Medicine

## 2018-06-16 ENCOUNTER — Encounter: Payer: Self-pay | Admitting: Family Medicine

## 2018-06-16 VITALS — BP 118/78 | HR 70 | Temp 97.9°F | Ht 70.0 in | Wt 383.0 lb

## 2018-06-16 DIAGNOSIS — G8929 Other chronic pain: Secondary | ICD-10-CM

## 2018-06-16 DIAGNOSIS — Z6841 Body Mass Index (BMI) 40.0 and over, adult: Secondary | ICD-10-CM | POA: Diagnosis not present

## 2018-06-16 DIAGNOSIS — Z8669 Personal history of other diseases of the nervous system and sense organs: Secondary | ICD-10-CM | POA: Diagnosis not present

## 2018-06-16 DIAGNOSIS — M25562 Pain in left knee: Secondary | ICD-10-CM | POA: Diagnosis not present

## 2018-06-16 DIAGNOSIS — M25561 Pain in right knee: Secondary | ICD-10-CM | POA: Diagnosis not present

## 2018-06-16 DIAGNOSIS — J302 Other seasonal allergic rhinitis: Secondary | ICD-10-CM | POA: Diagnosis not present

## 2018-06-16 DIAGNOSIS — M1711 Unilateral primary osteoarthritis, right knee: Secondary | ICD-10-CM | POA: Diagnosis not present

## 2018-06-16 DIAGNOSIS — Z7689 Persons encountering health services in other specified circumstances: Secondary | ICD-10-CM | POA: Diagnosis not present

## 2018-06-16 DIAGNOSIS — M1712 Unilateral primary osteoarthritis, left knee: Secondary | ICD-10-CM | POA: Diagnosis not present

## 2018-06-16 DIAGNOSIS — K582 Mixed irritable bowel syndrome: Secondary | ICD-10-CM | POA: Insufficient documentation

## 2018-06-16 NOTE — Progress Notes (Signed)
Patient presents to clinic today to f/u on chronic concers and to establish care.  Pt is accompanied by her mother Jeronica Stlouis and her infant son.  SUBJECTIVE: PMH: Pt is a 28 yo female with pmh sig for asthma, IBS, seasonal allergies, migraines.  Pt was previously seen at [facility].  Knee pain: -Bilateral times years. -Left worse than right. -Pt endorses patellar dislocation of left, knee edema, and pain. -Right knee with grinding sensation -Pt may take ibuprofen 800 mg once or twice a week, but tries not to. -Pt endorses repeated falls on left knee over the years. -In the past she was given a knee brace but denies having x-rays done.  Seasonal allergies: -Pt takes Zyrtec daily.  H/o migraines: -Will take ibuprofen as needed -May have 1-2 headaches every few weeks. -Patient tries not to take anything for her headaches. -Drinking 2-3 bottles of water per day  IBS: -Mixed -In the past patient was given medication but cannot recall the name of the medicine. -Pt endorses increased symptoms with spicy food or if stressed -Pt endorses abdominal pain, bloating, sudden urge to use the restroom -Pt has not been seen by GI -Has taken Pepcid in the past  Allergies: NKDA  Past surgical history: C-section 2018  Social history: Patient is single.  She has a 105-month-old son.  Patient works in Clinical biochemist.  Patient denies alcohol, tobacco, drug use.  Patient's parents are also seen by this provider.    Past Medical History:  Diagnosis Date  . Anemia   . Asthma   . Environmental allergies   . IBS (irritable bowel syndrome)   . S/P cesarean section 12/09/2017  . Trichomonas infection     Past Surgical History:  Procedure Laterality Date  . CESAREAN SECTION N/A 12/09/2017   Procedure: CESAREAN SECTION;  Surgeon: Sherian Rein, MD;  Location: [facility];  Service: Obstetrics;  Laterality: N/A;  . NO PAST SURGERIES      Current Outpatient  Medications on File Prior to Visit  Medication Sig Dispense Refill  . famotidine (PEPCID AC) 10 MG chewable tablet Chew 10 mg by mouth 2 (two) times daily.    Marland Kitchen ibuprofen (ADVIL,MOTRIN) 800 MG tablet Take 1 tablet (800 mg total) by mouth every 8 (eight) hours. 30 tablet 0   No current facility-administered medications on file prior to visit.     No Known Allergies  History reviewed. No pertinent family history.  Social History   Socioeconomic History  . Marital status: Single    Spouse name: Not on file  . Number of children: Not on file  . Years of education: Not on file  . Highest education level: Not on file  Occupational History  . Not on file  Social Needs  . Financial resource strain: Not on file  . Food insecurity:    Worry: Not on file    Inability: Not on file  . Transportation needs:    Medical: Not on file    Non-medical: Not on file  Tobacco Use  . Smoking status: Never Smoker  . Smokeless tobacco: Never Used  Substance and Sexual Activity  . Alcohol use: No  . Drug use: No  . Sexual activity: Yes    Comment: 1 sex partner in last 12 months  Lifestyle  . Physical activity:    Days per week: Not on file    Minutes per session: Not on file  . Stress: Not on file  Relationships  . Social connections:  Talks on phone: Not on file    Gets together: Not on file    Attends religious service: Not on file    Active member of club or organization: Not on file    Attends meetings of clubs or organizations: Not on file    Relationship status: Not on file  . Intimate partner violence:    Fear of current or ex partner: Not on file    Emotionally abused: Not on file    Physically abused: Not on file    Forced sexual activity: Not on file  Other Topics Concern  . Not on file  Social History Narrative  . Not on file    ROS General: Denies fever, chills, night sweats, changes in weight, changes in appetite HEENT: Denies headaches, ear pain, changes in  vision, rhinorrhea, sore throat CV: Denies CP, palpitations, SOB, orthopnea Pulm: Denies SOB, cough, wheezing GI: Denies nausea, vomiting    + abdominal pain, diarrhea, constipation GU: Denies dysuria, hematuria, frequency, vaginal discharge Msk: Denies muscle cramps    + joint pains- b/l knees, back Neuro: Denies weakness, numbness, tingling Skin: Denies rashes, bruising Psych: Denies depression, anxiety, hallucinations  BP 118/78 (BP Location: Left Arm, Patient Position: Sitting, Cuff Size: Large) Comment (Cuff Size): Thigh cuff used  Pulse 70   Temp 97.9 F (36.6 C) (Oral)   Ht 5\' 10"  (1.778 m)   Wt (!) 383 lb (173.7 kg)   LMP 05/21/2018 (Exact Date)   SpO2 97%   BMI 54.95 kg/m   Physical Exam Gen. Pleasant, well developed, well-nourished, in NAD HEENT - [place]/AT, PERRL, no scleral icterus, allergic salute present--nasal crease, no nasal drainage, pharynx without erythema or exudate.  TMs normal bilaterally.  No cervical lymphadenopathy.  Acanthosis nigricans noted and skin tags on neck Lungs: no use of accessory muscles, CTAB, no wheezes, rales or rhonchi Cardiovascular: RRR, No r/g/m, no peripheral edema Abdomen: BS present, soft, nontender, nondistended Musculoskeletal: No deformities, moves all four extremities, no cyanosis or clubbing, normal tone.  L knee edema >R knee, no TTP of joint line, increased laxity of L patellar tendon, no crepitus noted b/l. Neuro:  A&Ox3, CN II-XII intact, normal gait Skin:  Warm, dry, intact, no lesions  No results found for this or any previous visit (from the past 2160 hour(s)).  Assessment/Plan: Chronic pain of both knees -Discussed increased weight contributing to knee pain -discussed increasing quad strength to tighten up patellar tendon. -discussed walking for exercise a few times per wk. -heat, ice, biofreeze. - Plan: DG Knee Complete 4 Views Left, DG Knee Complete 4 Views Right  Seasonal allergies -Continue Zantac  Irritable  bowel syndrome with both constipation and diarrhea -Discussed reducing stress, noting which foods cause symptoms, increasing fiber -Handout given - Plan: Ambulatory referral to Gastroenterology  Obesity -BMI 54.92 -Discussed increasing physical activity by walking -Given handout  Migraines -Discussed reducing stress, increasing p.o. intake of water, getting plenty of rest each night -Okay to use ibuprofen as needed with food.  Encounter to establish care -We reviewed the PMH, PSH, FH, SH, Meds and Allergies. -We provided refills for any medications we will prescribe as needed. -We addressed current concerns per orders and patient instructions. -We have asked for records for pertinent exams, studies, vaccines and notes from previous providers. -We have advised patient to follow up per instructions below.  F/u in 1 month, sooner if needed  Abbe AmsterdamShannon Amandamarie Feggins, MD

## 2018-06-16 NOTE — Patient Instructions (Signed)
Diet for Irritable Bowel Syndrome When you have irritable bowel syndrome (IBS), the foods you eat and your eating habits are very important. IBS may cause various symptoms, such as abdominal pain, constipation, or diarrhea. Choosing the right foods can help ease discomfort caused by these symptoms. Work with your health care provider and dietitian to find the best eating plan to help control your symptoms. What general guidelines do I need to follow?  Keep a food diary. This will help you identify foods that cause symptoms. Write down: ? What you eat and when. ? What symptoms you have. ? When symptoms occur in relation to your meals.  Avoid foods that cause symptoms. Talk with your dietitian about other ways to get the same nutrients that are in these foods.  Eat more foods that contain fiber. Take a fiber supplement if directed by your dietitian.  Eat your meals slowly, in a relaxed setting.  Aim to eat 5-6 small meals per day. Do not skip meals.  Drink enough fluids to keep your urine clear or pale yellow.  Ask your health care provider if you should take an over-the-counter probiotic during flare-ups to help restore healthy gut bacteria.  If you have cramping or diarrhea, try making your meals low in fat and high in carbohydrates. Examples of carbohydrates are pasta, rice, whole grain breads and cereals, fruits, and vegetables.  If dairy products cause your symptoms to flare up, try eating less of them. You might be able to handle yogurt better than other dairy products because it contains bacteria that help with digestion. What foods are not recommended? The following are some foods and drinks that may worsen your symptoms:  Fatty foods, such as [place] fries.  Milk products, such as cheese or ice cream.  Chocolate.  Alcohol.  Products with caffeine, such as coffee.  Carbonated drinks, such as soda.  The items listed above may not be a complete list of foods and beverages to  avoid. Contact your dietitian for more information. What foods are good sources of fiber? Your health care provider or dietitian may recommend that you eat more foods that contain fiber. Fiber can help reduce constipation and other IBS symptoms. Add foods with fiber to your diet a little at a time so that your body can get used to them. Too much fiber at once might cause gas and swelling of your abdomen. The following are some foods that are good sources of fiber:  Apples.  Peaches.  Pears.  Berries.  Figs.  Broccoli (raw).  Cabbage.  Carrots.  Raw peas.  Kidney beans.  Lima beans.  Whole grain bread.  Whole grain cereal.  Where to find more information: [employer] for Functional Gastrointestinal Disorders: www.iffgd.[employer] National Institute of Diabetes and Digestive and Kidney Diseases: http://norris-lawson.com/www.niddk.nih.gov/health-information/health-topics/digestive-diseases/ibs/Pages/facts.aspx This information is not intended to replace advice given to you by your health care provider. Make sure you discuss any questions you have with your health care provider. Document Released: 03/08/2004 Document Revised: 05/24/2016 Document Reviewed: 03/19/2014 Elsevier Interactive Patient Education  2018 Elsevier Inc.  Knee Pain, Adult Many things can cause knee pain. The pain often goes away on its own with time and rest. If the pain does not go away, tests may be done to find out what is causing the pain. Follow these instructions at home: Activity  Rest your knee.  Do not do things that cause pain.  Avoid activities where both feet leave the ground at the same time (high-impact activities). Examples  are running, jumping rope, and doing jumping jacks. General instructions  Take medicines only as told by your doctor.  Raise (elevate) your knee when you are resting. Make sure your knee is higher than your heart.  Sleep with a pillow under your knee.  If told, put ice on the  knee: ? Put ice in a plastic bag. ? Place a towel between your skin and the bag. ? Leave the ice on for 20 minutes, 2-3 times a day.  Ask your doctor if you should wear an elastic knee support.  Lose weight if you are overweight. Being overweight can make your knee hurt more.  Do not use any tobacco products. These include cigarettes, chewing tobacco, or electronic cigarettes. If you need help quitting, ask your doctor. Smoking may slow down healing. Contact a doctor if:  The pain does not stop.  The pain changes or gets worse.  You have a fever along with knee pain.  Your knee gives out or locks up.  Your knee swells, and becomes worse. Get help right away if:  Your knee feels warm.  You cannot move your knee.  You have very bad knee pain.  You have chest pain.  You have trouble breathing. Summary  Many things can cause knee pain. The pain often goes away on its own with time and rest.  Avoid activities that put stress on your knee. These include running and jumping rope.  Get help right away if you cannot move your knee, or if your knee feels warm, or if you have trouble breathing. This information is not intended to replace advice given to you by your health care provider. Make sure you discuss any questions you have with your health care provider. Document Released: 03/15/2009 Document Revised: 12/11/2016 Document Reviewed: 12/11/2016 Elsevier Interactive Patient Education  2017 [employer].  Exercising to [employer] Exercising can help you to lose weight. In order to lose weight through exercise, you need to do vigorous-intensity exercise. You can tell that you are exercising with vigorous intensity if you are breathing very hard and fast and cannot hold a conversation while exercising. Moderate-intensity exercise helps to maintain your current weight. You can tell that you are exercising at a moderate level if you have a higher heart rate and faster breathing,  but you are still able to hold a conversation. How often should I exercise? Choose an activity that you enjoy and set realistic goals. Your health care provider can help you to make an activity plan that works for you. Exercise regularly as directed by your health care provider. This may include:  Doing resistance training twice each week, such as: ? Push-ups. ? Sit-ups. ? Lifting weights. ? Using resistance bands.  Doing a given intensity of exercise for a given amount of time. Choose from these options: ? 150 minutes of moderate-intensity exercise every week. ? 75 minutes of vigorous-intensity exercise every week. ? A mix of moderate-intensity and vigorous-intensity exercise every week.  Children, pregnant women, people who are out of shape, people who are overweight, and older adults may need to consult a health care provider for individual recommendations. If you have any sort of medical condition, be sure to consult your health care provider before starting a new exercise program. What are some activities that can help me to lose weight?  Walking at a rate of at least 4.5 miles an hour.  Jogging or running at a rate of 5 miles per hour.  Biking at a  rate of at least 10 miles per hour.  Lap swimming.  Roller-skating or in-line skating.  Cross-country skiing.  Vigorous competitive sports, such as football, basketball, and soccer.  Jumping rope.  Aerobic dancing. How can I be more active in my day-to-day activities?  Use the stairs instead of the elevator.  Take a walk during your lunch break.  If you drive, park your car farther away from work or school.  If you take public transportation, get off one stop early and walk the rest of the way.  Make all of your phone calls while standing up and walking around.  Get up, stretch, and walk around every 30 minutes throughout the day. What guidelines should I follow while exercising?  Do not exercise so much that you  hurt yourself, feel dizzy, or get very short of breath.  Consult your health care provider prior to starting a new exercise program.  Wear comfortable clothes and shoes with good support.  Drink plenty of water while you exercise to prevent dehydration or heat stroke. Body water is lost during exercise and must be replaced.  Work out until you breathe faster and your heart beats faster. This information is not intended to replace advice given to you by your health care provider. Make sure you discuss any questions you have with your health care provider. Document Released: 01/19/2011 Document Revised: 05/24/2016 Document Reviewed: 05/20/2014 Elsevier Interactive Patient Education  [employer].

## 2018-06-20 ENCOUNTER — Telehealth: Payer: Self-pay | Admitting: Family Medicine

## 2018-06-20 NOTE — Telephone Encounter (Signed)
Pt dropped off FMLA forms to be completed upon completion pt would like to have a copy and faxed to [phone] with claim #[id number] included on the fax.  Forms put in doctors folder for completion.

## 2018-06-23 NOTE — Telephone Encounter (Signed)
Pt dropped off new FMLA paperwork to be completed. Upon completion pt would like to have a copy and have it faxed to [phone] with claim #[id number] included on the fax. Forms put in doctors folder for completion.

## 2018-06-23 NOTE — Telephone Encounter (Signed)
Form has been received awaiting Dr Salomon FickBanks for completion

## 2018-06-24 ENCOUNTER — Telehealth: Payer: Self-pay | Admitting: Family Medicine

## 2018-06-24 NOTE — Telephone Encounter (Signed)
Copied from CRM [id number]. Topic: Quick Communication - See Telephone Encounter >> Jun 24, 2018  3:19 PM Raquel SarnaHayes, Teresa G wrote: Mable FillLynn ADA specialist [employer] [phone]- [phone]   Missing the 2nd page that the doctor signs.  It also has the restrictions and limitations.

## 2018-06-24 NOTE — Telephone Encounter (Signed)
Pt states that she will pick up the form at the office.

## 2018-06-24 NOTE — Telephone Encounter (Signed)
Form has been completed faxed and pt is aware

## 2018-06-25 NOTE — Telephone Encounter (Signed)
Called [name] ADA specialist regarding pt FMLA form, left a detailed message to refax the missing page so Dr Salomon FickBanks can complete and fax it back.

## 2018-06-26 ENCOUNTER — Encounter: Payer: Self-pay | Admitting: Gastroenterology

## 2018-06-30 NOTE — Telephone Encounter (Signed)
Sandra Logan with [employer] called in to follow up on FMLA forms, please call back to advise.   CB: [phone]

## 2018-06-30 NOTE — Telephone Encounter (Signed)
Second page of the form received awaiting Dr Salomon FickBanks to complete and fax.

## 2018-06-30 NOTE — Telephone Encounter (Signed)
Pt called in to follow up on FMLA form. Advised pt of status but pt would like to be sure because she says that she has 2 different FMLA forms being completed for 2 different reasons. Please advise.   CB: [phone]

## 2018-06-30 NOTE — Telephone Encounter (Signed)
Called pt left a detailed message regarding to her concern about her FMLA form, form was missing a page which [facility] and is awaiting for completion.

## 2018-07-02 NOTE — Telephone Encounter (Signed)
FMLA form was received and was refaxed to the office. Nothing further needed

## 2018-07-04 NOTE — Telephone Encounter (Signed)
Noted. Dr. Salomon FickBanks, do you want to complete additional FMLA for IBS until she sees GI? Thanks.

## 2018-07-04 NOTE — Telephone Encounter (Signed)
Spoke with patient, aware of the below update on her FMLA paperwork.  Pt will call back if any further issues regarding the FMLA [facility] to [facility] as Lorain ChildesFYI

## 2018-07-04 NOTE — Telephone Encounter (Signed)
Intermittent FMLA forms for knee pain faxed to [employer]. Fax confirmation received.  Fitness for duty not yet completed. Per Dr. Salomon FickBanks, it will be completed once she returns to work.   Called Lyn, FMLA rep at [employer] at [phone]. She has not yet received the FMLA forms we faxed but will keep checking for them. She confirmed it is fine if fitness for duty form is completed later.  Per Lyn, they do not have to receive any of this paperwork before patient can go back to work. Patient can return to work whenever Dr. Salomon FickBanks says she can return. Lyn also wanted to clarify if the FMLA was for IBS and knee pain. I explained that it was for knee pain only, and Dr. Salomon FickBanks referred patient to GI for management of IBS. Patient has upcoming appt w/ GI on 08/28/18.  Lyn wanted to know if patient will need additional leave time for IBS prior to seeing GI. If so, we will need to fill out FMLA specifically for that.  Called patient and left message to return call to inform her of above.

## 2018-07-04 NOTE — Telephone Encounter (Signed)
Relation to pt: self Call back number: [phone]    Reason for call:  Patient checking on the status of "fit for duty" and "fmla" forms, stating employer denying receiving and would like forms fax to patient fax # [phone]. Patient states its very important forms are faxed today to secure her job by Monday and would like a follow up call today to ensure confirmation.

## 2018-07-07 DIAGNOSIS — M25562 Pain in left knee: Secondary | ICD-10-CM | POA: Diagnosis not present

## 2018-07-07 DIAGNOSIS — S8992XA Unspecified injury of left lower leg, initial encounter: Secondary | ICD-10-CM | POA: Diagnosis not present

## 2018-07-07 DIAGNOSIS — M1712 Unilateral primary osteoarthritis, left knee: Secondary | ICD-10-CM | POA: Diagnosis not present

## 2018-07-07 NOTE — Telephone Encounter (Signed)
I will let them do that.

## 2018-07-10 ENCOUNTER — Telehealth: Payer: Self-pay

## 2018-07-10 NOTE — Telephone Encounter (Signed)
Spoke to [place] that partners with [employer]. [name] states that she and the people that over [employer] for workers have some questions regarding pt recent FMLA paperwork.  1. Pt is stating that she has IBS. However, Pt FMLA paperwork was only for knee issues. [name] wants to know if this issue was suppose to have been added. Pt stated that Dr.Banks was following her regarding the IBS until she was able to fine a GI specialist.   2. They also want to know if the 2 months could be avoided if they provide the pt with   A. Frequent breaks- Will pt benefit from frequent breaks? If so how often Ex: 20 min. Break every 2 hrs.     B. Tele-commute If pt has the option to work from home will she benefit from this? This will possible help since she will be in the privacy of her own home and can break when she feels is necessary.     C. Will pt benefit from a Sit-Stand desk?

## 2018-07-11 NOTE — Telephone Encounter (Signed)
Please Advise

## 2018-07-21 NOTE — Telephone Encounter (Signed)
Called [name] with FMLA office, left a voicemail to return my call in the office regarding pt FMLA form

## 2018-07-21 NOTE — Telephone Encounter (Signed)
1. This provider has seen pt 1 time to est care.  Pt will need to have GI complete FMLA regarding IBS or her previous provider who was managing this condition.    2. Ok for pt to take a break every 30 mins to 1 hr or work from home some days if possible.

## 2018-07-25 NOTE — Telephone Encounter (Signed)
Pt has a GI appointment on 08/28/2018 with [facility] regarding her IBS problem.

## 2018-08-28 ENCOUNTER — Ambulatory Visit: Payer: BLUE CROSS/BLUE SHIELD | Admitting: Gastroenterology

## 2018-09-02 ENCOUNTER — Encounter: Payer: Self-pay | Admitting: Family Medicine

## 2018-12-11 IMAGING — DX DG KNEE COMPLETE 4+V*R*
4 series · 4 of 4 positions shown · non-contrast
Comparison: Left knee series of February 12, 2012.

CLINICAL DATA: Chronic bilateral knee pain greatest on the left
with limited range of motion on the left and inability to been the
left knee. No known injury.

EXAM:
RIGHT KNEE - COMPLETE 4+ VIEW; LEFT KNEE - COMPLETE 4+ VIEW

[knee ap]
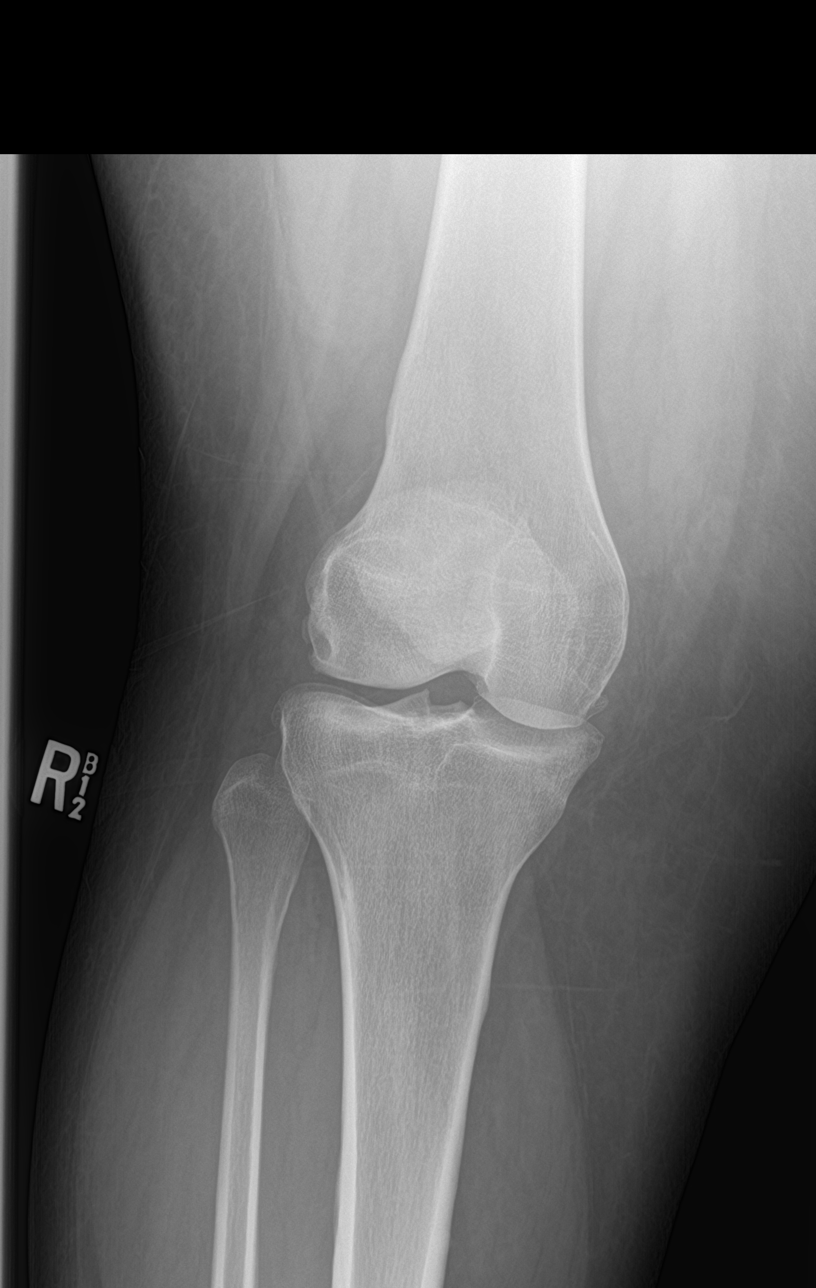

[knee tunnel]
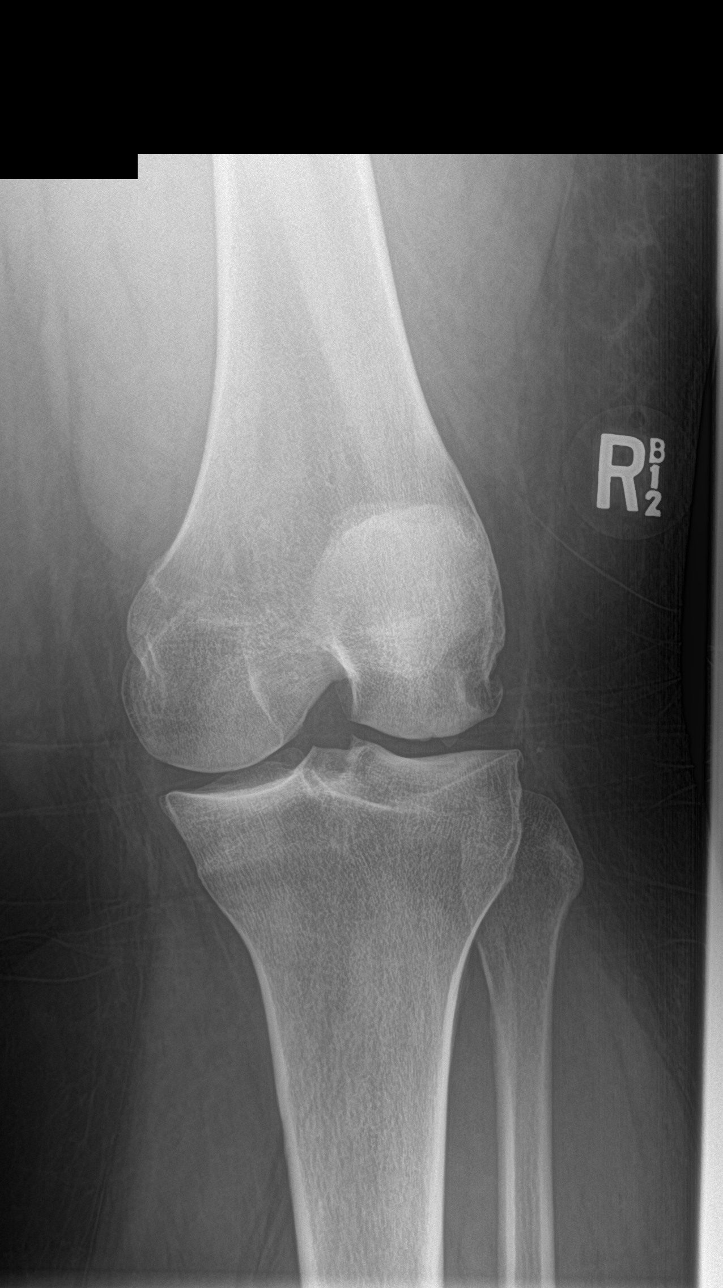

[knee lat]
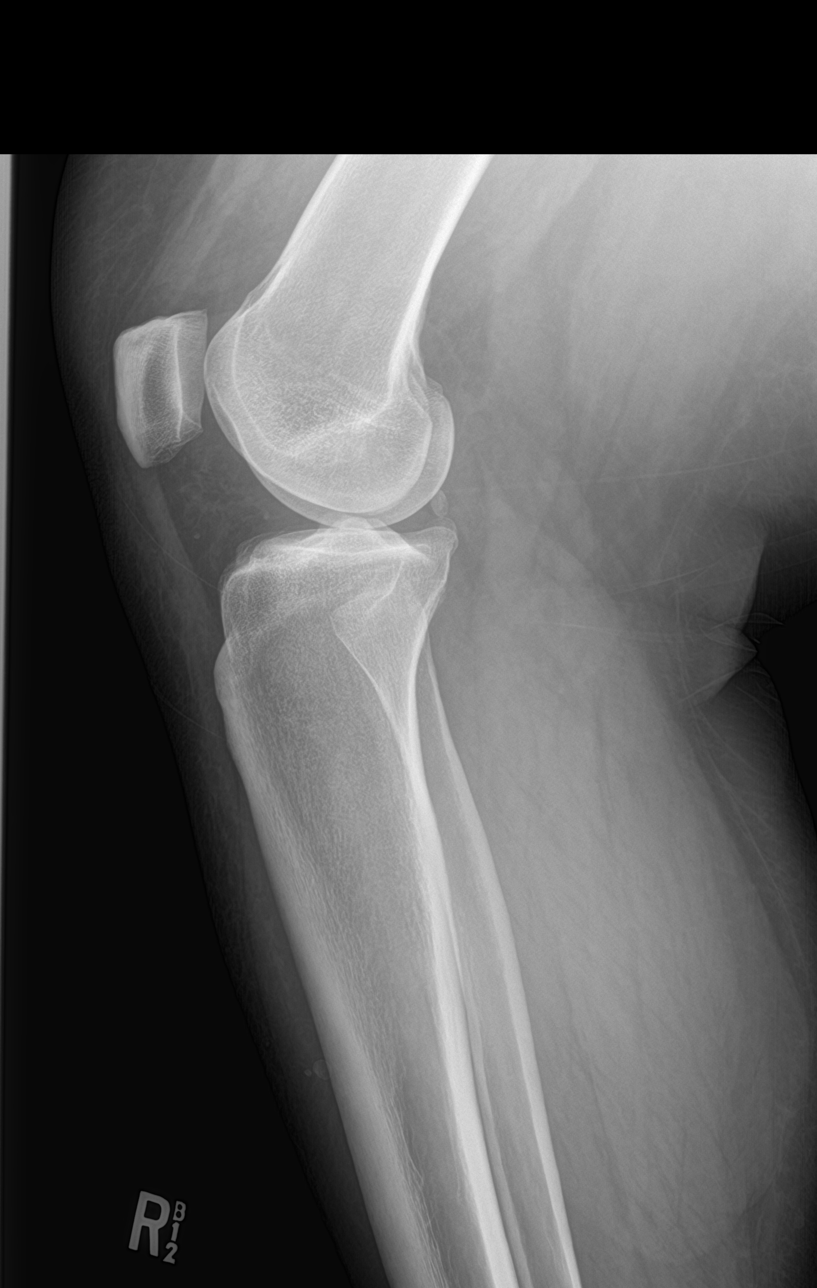

[sunrise]
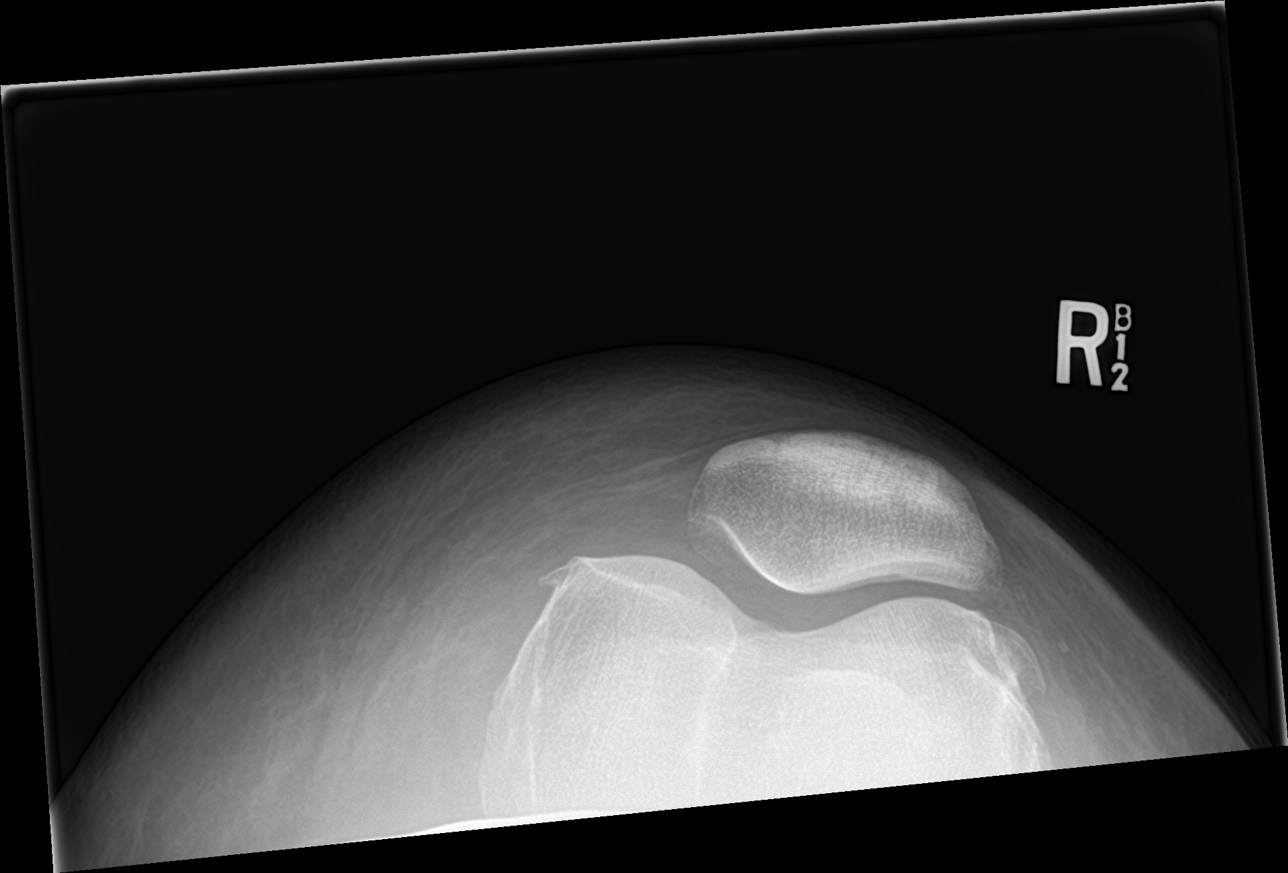

[4 of 4 positions shown; findings below may reference images not displayed]

FINDINGS: Left knee: The bones are subjectively adequately mineralized. There
is mild narrowing of the lateral joint compartment. Small spurs
arise from the articular margins of the medial and lateral femoral
condyles. There is a spur arising from the periphery of the lateral
tibial plateau. There is no acute fracture. There is minimal
spurring of the articular margins of the patella. There is no joint
effusion.

Right knee: The bones are subjectively adequately mineralized. The
joint spaces are reasonably well-maintained. There is minimal
beaking of the tibial spines. Small spurs arise from the articular
margins of the patella. There is no acute fracture or dislocation or
joint effusion.
IMPRESSION: Moderate osteoarthritic change centered on the lateral compartment
of the left knee with minimal changes of the medial and
patellofemoral compartments.

Mild osteoarthritic spurring of the tibial spines and articular
margins of the patella of the right knee.

## 2020-01-04 ENCOUNTER — Ambulatory Visit: Payer: Medicaid Other | Attending: Internal Medicine

## 2020-01-04 DIAGNOSIS — Z20822 Contact with and (suspected) exposure to covid-19: Secondary | ICD-10-CM

## 2020-01-05 LAB — NOVEL CORONAVIRUS, NAA: SARS-CoV-2, NAA: NOT DETECTED

## 2020-01-18 ENCOUNTER — Ambulatory Visit: Payer: Medicaid Other | Attending: Internal Medicine

## 2020-01-18 ENCOUNTER — Other Ambulatory Visit: Payer: Medicaid Other

## 2020-01-19 ENCOUNTER — Ambulatory Visit: Payer: Medicaid Other | Attending: Internal Medicine

## 2020-01-19 DIAGNOSIS — Z20822 Contact with and (suspected) exposure to covid-19: Secondary | ICD-10-CM | POA: Diagnosis not present

## 2020-01-20 LAB — NOVEL CORONAVIRUS, NAA: SARS-CoV-2, NAA: DETECTED — AB

## 2020-01-21 ENCOUNTER — Telehealth: Payer: Self-pay | Admitting: Nurse Practitioner

## 2020-01-21 NOTE — Telephone Encounter (Signed)
Called to discuss with Elicia Lamp Guinn about Covid symptoms and the use of bamlanivimab, a monoclonal antibody infusion for those with mild to moderate Covid symptoms and at a high risk of hospitalization.    Pt does not qualify for infusion therapy as she has asymptomatic infection. Patient reports she had symptoms initially 2 weeks ago however, she tested negative. She has now tested positive, but reports symptoms have improved and almost resolved. Isolation precautions discussed. Advised to contact back for consideration should she develop symptoms. Patient verbalized understanding.   Patient Active Problem List   Diagnosis Date Noted  . History of migraine 06/16/2018  . Class 3 severe obesity due to excess calories without serious comorbidity with body mass index (BMI) of 50.0 to 59.9 in adult (HCC) 06/16/2018  . Irritable bowel syndrome with both constipation and diarrhea 06/16/2018  . Seasonal allergies 06/16/2018  . PIH (pregnancy induced hypertension) 12/09/2017  . S/P cesarean section 12/09/2017  . TINEA VERSICOLOR 09/07/2010  . KNEE PAIN, LEFT 12/09/2009  . INTERTRIGO 11/11/2009  . ANKLE PAIN, LEFT 11/11/2009  . OBESITY, MORBID 08/03/2009  . MIGRAINE, COMMON 08/03/2009  . ALLERGIC RHINITIS, SEASONAL 08/03/2009  . EXERCISE INDUCED ASTHMA 08/03/2009    Willette Alma, AGPCNP-BC Pager: [phone] Amion: N. Cousar

## 2020-02-16 ENCOUNTER — Ambulatory Visit: Payer: Medicaid Other

## 2020-02-17 ENCOUNTER — Ambulatory Visit: Payer: Medicaid Other | Attending: Internal Medicine

## 2020-02-17 ENCOUNTER — Telehealth: Payer: Medicaid Other | Admitting: Family Medicine

## 2020-02-17 DIAGNOSIS — Z20822 Contact with and (suspected) exposure to covid-19: Secondary | ICD-10-CM

## 2020-02-18 LAB — NOVEL CORONAVIRUS, NAA: SARS-CoV-2, NAA: NOT DETECTED

## 2020-02-19 ENCOUNTER — Telehealth (INDEPENDENT_AMBULATORY_CARE_PROVIDER_SITE_OTHER): Payer: BC Managed Care – PPO | Admitting: Family Medicine

## 2020-02-19 DIAGNOSIS — K580 Irritable bowel syndrome with diarrhea: Secondary | ICD-10-CM

## 2020-02-19 DIAGNOSIS — Z8616 Personal history of COVID-19: Secondary | ICD-10-CM | POA: Diagnosis not present

## 2020-02-19 DIAGNOSIS — J302 Other seasonal allergic rhinitis: Secondary | ICD-10-CM

## 2020-02-19 DIAGNOSIS — J4540 Moderate persistent asthma, uncomplicated: Secondary | ICD-10-CM | POA: Diagnosis not present

## 2020-02-19 MED ORDER — ALBUTEROL SULFATE HFA 108 (90 BASE) MCG/ACT IN AERS: 2.0000 | INHALATION_SPRAY | Freq: Four times a day (QID) | RESPIRATORY_TRACT | 3 refills | Status: AC | PRN

## 2020-02-19 NOTE — Patient Instructions (Signed)
Low-FODMAP Eating Plan  FODMAPs (fermentable oligosaccharides, disaccharides, monosaccharides, and polyols) are sugars that are hard for some people to digest. A low-FODMAP eating plan may help some people who have bowel (intestinal) diseases to manage their symptoms. This meal plan can be complicated to follow. Work with a diet and nutrition specialist (dietitian) to make a low-FODMAP eating plan that is right for you. A dietitian can make sure that you get enough nutrition from this diet. What are tips for following this plan? Reading food labels  Check labels for hidden FODMAPs such as: ? High-fructose syrup. ? Honey. ? Agave. ? Natural fruit flavors. ? Onion or garlic powder.  Choose low-FODMAP foods that contain 3-4 grams of fiber per serving.  Check food labels for serving sizes. Eat only one serving at a time to make sure FODMAP levels stay low. Meal planning  Follow a low-FODMAP eating plan for up to 6 weeks, or as told by your health care provider or dietitian.  To follow the eating plan: 1. Eliminate high-FODMAP foods from your diet completely. 2. Gradually reintroduce high-FODMAP foods into your diet one at a time. Most people should wait a few days after introducing one high-FODMAP food before they introduce the next high-FODMAP food. Your dietitian can recommend how quickly you may reintroduce foods. 3. Keep a daily record of what you eat and drink, and make note of any symptoms that you have after eating. 4. Review your daily record with a dietitian regularly. Your dietitian can help you identify which foods you can eat and which foods you should avoid. General tips  Drink enough fluid each day to keep your urine pale yellow.  Avoid processed foods. These often have added sugar and may be high in FODMAPs.  Avoid most dairy products, whole grains, and sweeteners.  Work with a dietitian to make sure you get enough fiber in your diet. Recommended  foods Grains  Gluten-free grains, such as rice, oats, buckwheat, quinoa, corn, polenta, and millet. Gluten-free pasta, bread, or cereal. Rice noodles. Corn tortillas. Vegetables  Eggplant, zucchini, cucumber, peppers, green beans, Brussels sprouts, bean sprouts, lettuce, arugula, kale, Swiss chard, spinach, collard greens, bok choy, summer squash, potato, and tomato. Limited amounts of corn, carrot, and sweet potato. Green parts of scallions. Fruits  Bananas, oranges, lemons, limes, blueberries, raspberries, strawberries, grapes, cantaloupe, honeydew melon, kiwi, papaya, passion fruit, and pineapple. Limited amounts of dried cranberries, banana chips, and shredded coconut. Dairy  Lactose-free milk, yogurt, and kefir. Lactose-free cottage cheese and ice cream. Non-dairy milks, such as almond, coconut, hemp, and rice milk. Yogurts made of non-dairy milks. Limited amounts of goat cheese, brie, mozzarella, parmesan, swiss, and other hard cheeses. Meats and other protein foods  Unseasoned beef, pork, poultry, or fish. Eggs. Bacon. Tofu (firm) and tempeh. Limited amounts of nuts and seeds, such as almonds, walnuts, brazil nuts, pecans, peanuts, pumpkin seeds, chia seeds, and sunflower seeds. Fats and oils  Butter-free spreads. Vegetable oils, such as olive, canola, and sunflower oil. Seasoning and other foods  Artificial sweeteners with names that do not end in "ol" such as aspartame, saccharine, and stevia. Maple syrup, white table sugar, raw sugar, brown sugar, and molasses. Fresh basil, coriander, parsley, rosemary, and thyme. Beverages  Water and mineral water. Sugar-sweetened soft drinks. Small amounts of orange juice or cranberry juice. Black and green tea. Most dry wines. Coffee. This may not be a complete list of low-FODMAP foods. Talk with your dietitian for more information. Foods to avoid Grains  Wheat,   including kamut, durum, and semolina. Barley and bulgur. Couscous. Wheat-based  cereals. Wheat noodles, bread, crackers, and pastries. Vegetables  Chicory root, artichoke, asparagus, cabbage, snow peas, sugar snap peas, mushrooms, and cauliflower. Onions, garlic, leeks, and the white part of scallions. Fruits  Fresh, dried, and juiced forms of apple, pear, watermelon, peach, plum, cherries, apricots, blackberries, boysenberries, figs, nectarines, and mango. Avocado. Dairy  Milk, yogurt, ice cream, and soft cheese. Cream and sour cream. Milk-based sauces. Custard. Meats and other protein foods  Fried or fatty meat. Sausage. Cashews and pistachios. Soybeans, baked beans, black beans, chickpeas, kidney beans, fava beans, navy beans, lentils, and split peas. Seasoning and other foods  Any sugar-free gum or candy. Foods that contain artificial sweeteners such as sorbitol, mannitol, isomalt, or xylitol. Foods that contain honey, high-fructose corn syrup, or agave. Bouillon, vegetable stock, beef stock, and chicken stock. Garlic and onion powder. Condiments made with onion, such as hummus, chutney, pickles, relish, salad dressing, and salsa. Tomato paste. Beverages  Chicory-based drinks. Coffee substitutes. Chamomile tea. Fennel tea. Sweet or fortified wines such as port or sherry. Diet soft drinks made with isomalt, mannitol, maltitol, sorbitol, or xylitol. Apple, pear, and mango juice. Juices with high-fructose corn syrup. This may not be a complete list of high-FODMAP foods. Talk with your dietitian to discuss what dietary choices are best for you.  Summary  A low-FODMAP eating plan is a short-term diet that eliminates FODMAPs from your diet to help ease symptoms of certain bowel diseases.  The eating plan usually lasts up to 6 weeks. After that, high-FODMAP foods are restarted gradually, one at a time, so you can find out which may be causing symptoms.  A low-FODMAP eating plan can be complicated. It is best to work with a dietitian who has experience with this type of  plan. This information is not intended to replace advice given to you by your health care provider. Make sure you discuss any questions you have with your health care provider. Document Revised: 11/29/2017 Document Reviewed: 08/13/2017 Elsevier Patient Education  2020 Elsevier Inc.  Diet for Irritable Bowel Syndrome When you have irritable bowel syndrome (IBS), it is very important to eat the foods and follow the eating habits that are best for your condition. IBS may cause various symptoms such as pain in the abdomen, constipation, or diarrhea. Choosing the right foods can help to ease the discomfort from these symptoms. Work with your health care provider and diet and nutrition specialist (dietitian) to find the eating plan that will help to control your symptoms. What are tips for following this plan?      Keep a food diary. This will help you identify foods that cause symptoms. Write down: ? What you eat and when you eat it. ? What symptoms you have. ? When symptoms occur in relation to your meals, such as "pain in abdomen 2 hours after dinner."  Eat your meals slowly and in a relaxed setting.  Aim to eat 5-6 small meals per day. Do not skip meals.  Drink enough fluid to keep your urine pale yellow.  Ask your health care provider if you should take an over-the-counter probiotic to help restore healthy bacteria in your gut (digestive tract). ? Probiotics are foods that contain good bacteria and yeasts.  Your dietitian may have specific dietary recommendations for you based on your symptoms. He or she may recommend that you: ? Avoid foods that cause symptoms. Talk with your dietitian about other ways to get the   same nutrients that are in those problem foods. ? Avoid foods with gluten. Gluten is a protein that is found in rye, wheat, and barley. ? Eat more foods that contain soluble fiber. Examples of foods with high soluble fiber include oats, seeds, and certain fruits and vegetables.  Take a fiber supplement if directed by your dietitian. ? Reduce or avoid certain foods called FODMAPs. These are foods that contain carbohydrates that are hard to digest. Ask your doctor which foods contain these carbohydrates. What foods are not recommended? The following are some foods and drinks that may make your symptoms worse:  Fatty foods, such as french fries.  Foods that contain gluten, such as pasta and cereal.  Dairy products, such as milk, cheese, and ice cream.  Chocolate.  Alcohol.  Products with caffeine, such as coffee.  Carbonated drinks, such as soda.  Foods that are high in FODMAPs. These include certain fruits and vegetables.  Products with sweeteners such as honey, high fructose corn syrup, sorbitol, and mannitol. The items listed above may not be a complete list of foods and beverages you should avoid. Contact a dietitian for more information. What foods are good sources of fiber? Your health care provider or dietitian may recommend that you eat more foods that contain fiber. Fiber can help to reduce constipation and other IBS symptoms. Add foods with fiber to your diet a little at a time so your body can get used to them. Too much fiber at one time might cause gas and swelling of your abdomen. The following are some foods that are good sources of fiber:  Berries, such as raspberries, strawberries, and blueberries.  Tomatoes.  Carrots.  Brown rice.  Oats.  Seeds, such as chia and pumpkin seeds. The items listed above may not be a complete list of recommended sources of fiber. Contact your dietitian for more options. Where to find more information  International Foundation for Functional Gastrointestinal Disorders: www.iffgd.org  National Institute of Diabetes and Digestive and Kidney Diseases: www.niddk.nih.gov Summary  When you have irritable bowel syndrome (IBS), it is very important to eat the foods and follow the eating habits that are best for  your condition.  IBS may cause various symptoms such as pain in the abdomen, constipation, or diarrhea.  Choosing the right foods can help to ease the discomfort that comes from symptoms.  Keep a food diary. This will help you identify foods that cause symptoms.  Your health care provider or diet and nutrition specialist (dietitian) may recommend that you eat more foods that contain fiber. This information is not intended to replace advice given to you by your health care provider. Make sure you discuss any questions you have with your health care provider. Document Revised: 04/08/2019 Document Reviewed: 08/20/2017 Elsevier Patient Education  2020 Elsevier Inc.  

## 2020-02-19 NOTE — Progress Notes (Signed)
Virtual Visit via Video Note  I connected with Sandra Logan on 02/19/20 at 10:30 AM EST by a video enabled telemedicine application 2/2 COVID-19 pandemic and verified that I am speaking with the correct person using two identifiers.  Location patient: home Location provider:work or home office Persons participating in the virtual visit: patient, provider  I discussed the limitations of evaluation and management by telemedicine and the availability of in person appointments. The patient expressed understanding and agreed to proceed.   HPI: Pt is a 30 yo female with pmh sig for asthma, allergies, obesity, IBS, and migraines who was seen for ongoing issues.   Dx'd with COVID-19 on 01/18/19.  Sick contacts included co-workers.  Pt trying to work from home since as she does not feel comfortable going back.  Endorses issues with asthma, more congestion, SOB, and coughing since it has been cold.   Pt is out of her inhaler.  Endorses throat feeling dry and watery eyes.  Started Zyrtec daily.  Also with increased IBS-D symptoms.  Notes increased loose BMs (5-7/day), abd pain, and bloating.  Avoids diary as is lactose intolerant.  Symptoms increase when stressed or upset.  Pt has forms for work as she has to take frequent bathroom breaks.  ROS: See pertinent positives and negatives per HPI.  Past Medical History:  Diagnosis Date  . Anemia   . Asthma   . Environmental allergies   . IBS (irritable bowel syndrome)   . Migraine   . S/P cesarean section 12/09/2017  . Trichomonas infection   . UTI (urinary tract infection)     Past Surgical History:  Procedure Laterality Date  . CESAREAN SECTION N/A 12/09/2017   Procedure: CESAREAN SECTION;  Surgeon: Sherian Rein, MD;  Location: [facility];  Service: Obstetrics;  Laterality: N/A;  . NO PAST SURGERIES      Family History  Problem Relation Age of Onset  . Asthma Mother   . Depression Mother   . Hyperlipidemia Mother   .  Hypertension Mother   . Hypertension Father   . Diabetes Father   . Arthritis Maternal Grandmother   . COPD Maternal Grandmother   . Depression Maternal Grandmother   . Diabetes Maternal Grandmother   . Heart attack Maternal Grandmother   . Heart disease Maternal Grandmother   . Hypertension Maternal Grandmother   . Stroke Maternal Grandmother   . Hypertension Maternal Grandfather   . Hyperlipidemia Maternal Grandfather   . Heart disease Maternal Grandfather   . Diabetes Maternal Grandfather   . Birth defects Maternal Grandfather   . Asthma Maternal Grandfather   . Arthritis Maternal Grandfather   . Arthritis Paternal Grandmother   . Arthritis Paternal Grandfather      Current Outpatient Medications:  .  famotidine (PEPCID AC) 10 MG chewable tablet, Chew 10 mg by mouth 2 (two) times daily., Disp: , Rfl:  .  ibuprofen (ADVIL,MOTRIN) 800 MG tablet, Take 1 tablet (800 mg total) by mouth every 8 (eight) hours., Disp: 30 tablet, Rfl: 0  EXAM:  VITALS per patient if applicable: RR between 12-20 bpm  GENERAL: alert, oriented, appears well and in no acute distress  HEENT: atraumatic, conjunctiva clear, allergic salute/crease on bridge of nose, no other obvious abnormalities on inspection of external nose and ears  NECK: normal movements of the head and neck  LUNGS: on inspection no signs of respiratory distress, breathing rate appears normal, no obvious gross SOB, gasping or wheezing  CV: no obvious cyanosis  MS: moves  all visible extremities without noticeable abnormality  PSYCH/NEURO: pleasant and cooperative, no obvious depression or anxiety, speech and thought processing grossly intact  ASSESSMENT AND PLAN:  Discussed the following assessment and plan:  Moderate persistent asthma without complication  -stable -likely exacerbated by weather changes and recent COVID dx. - Plan: albuterol (VENTOLIN HFA) 108 (90 Base) MCG/ACT inhaler -given precautions  Irritable bowel  syndrome with diarrhea -food diary -start low FODMAP diet -start probiotic -pt to drop off or fax forms to office.  Advised to try faxing next wk as the fax machine has been down. -for continued symptoms consider Bentyl and GI referral   Seasonal allergies  -continue Zyrtec daily -consider flonase  Personal h/o COVID-19 -recovered  F/u in 1 month, sooner if needed.   I discussed the assessment and treatment plan with the patient. The patient was provided an opportunity to ask questions and all were answered. The patient agreed with the plan and demonstrated an understanding of the instructions.   The patient was advised to call back or seek an in-person evaluation if the symptoms worsen or if the condition fails to improve as anticipated.  [name], MD

## 2020-02-23 ENCOUNTER — Encounter: Payer: Self-pay | Admitting: Family Medicine

## 2020-03-04 ENCOUNTER — Telehealth: Payer: Self-pay

## 2020-03-04 NOTE — Telephone Encounter (Signed)
Called pt no option of leaving a voicemail for pt to pick up her healthcare form from the office

## 2020-03-07 ENCOUNTER — Telehealth: Payer: Self-pay

## 2020-03-07 NOTE — Telephone Encounter (Signed)
Pt picked up her health care provider paperwork this morning 03/07/2020, copy was sent to scanning

## 2020-03-18 ENCOUNTER — Ambulatory Visit: Payer: BC Managed Care – PPO | Attending: Internal Medicine

## 2020-03-18 ENCOUNTER — Other Ambulatory Visit: Payer: BC Managed Care – PPO

## 2020-03-18 DIAGNOSIS — Z20822 Contact with and (suspected) exposure to covid-19: Secondary | ICD-10-CM | POA: Diagnosis not present

## 2020-03-19 LAB — NOVEL CORONAVIRUS, NAA: SARS-CoV-2, NAA: NOT DETECTED

## 2020-03-25 ENCOUNTER — Encounter: Payer: Self-pay | Admitting: Family Medicine

## 2020-04-01 ENCOUNTER — Encounter: Payer: Self-pay | Admitting: Family Medicine

## 2020-04-04 NOTE — Telephone Encounter (Signed)
Attempted to call pt but mailbox is full and no way to leave pt a message, sent a MyChart message to  pt  regarding her form that needed completing.

## 2020-04-05 NOTE — Telephone Encounter (Signed)
Pt was returning Nancy's call. Pt needs a call back [phone]

## 2020-04-05 NOTE — Telephone Encounter (Signed)
Spoke with pt verbalized understanding of Dr Salomon Fick advise and recommendations regarding her Job accommodation request form

## 2020-04-07 ENCOUNTER — Encounter: Payer: Self-pay | Admitting: Nurse Practitioner

## 2020-04-07 ENCOUNTER — Encounter: Payer: Self-pay | Admitting: Family Medicine

## 2020-04-13 ENCOUNTER — Telehealth: Payer: Self-pay

## 2020-04-13 NOTE — Telephone Encounter (Signed)
Sent MyChart message to pt with Dr Salomon Fick advise stating that Dr Salomon Fick is unable to complete your Handicap Placard, advises if you are seeing a specialist for your knee to have them complete the form, Advised pt to call our office with any questions

## 2020-04-19 ENCOUNTER — Ambulatory Visit: Payer: BC Managed Care – PPO | Admitting: Orthopaedic Surgery

## 2020-04-20 ENCOUNTER — Ambulatory Visit: Payer: BC Managed Care – PPO | Admitting: Nurse Practitioner

## 2020-05-10 NOTE — Progress Notes (Deleted)
     05/10/2020 Sandra Logan [id number] 26-Oct-1990   CHIEF COMPLAINT:   HISTORY OF PRESENT ILLNESS:  Sandra Logan is a 30 year old female with a past medical history of asthma, migraine headaches, anemia and IBS symptoms.  Sandra Logan is a 30 year old female with a past medical history of asthma, migraine headaches, anemia and IBS symptoms.   Probiotic, FODMAP diet ?  No recent labs in Epic    Past Medical History:  Diagnosis Date  . Anemia   . Asthma   . Environmental allergies   . IBS (irritable bowel syndrome)   . Migraine   . S/P cesarean section 12/09/2017  . Trichomonas infection   . UTI (urinary tract infection)    Past Surgical History:  Procedure Laterality Date  . CESAREAN SECTION N/A 12/09/2017   Procedure: CESAREAN SECTION;  Surgeon: Sherian Rein, MD;  Location: [facility];  Service: Obstetrics;  Laterality: N/A;  . NO PAST SURGERIES      Social History:   Family History:    reports that she has never smoked. She has never used smokeless tobacco. She reports that she does not drink alcohol or use drugs. family history includes Arthritis in her maternal grandfather, maternal grandmother, paternal grandfather, and paternal grandmother; Asthma in her maternal grandfather and mother; Birth defects in her maternal grandfather; COPD in her maternal grandmother; Depression in her maternal grandmother and mother; Diabetes in her father, maternal grandfather, and maternal grandmother; Heart attack in her maternal grandmother; Heart disease in her maternal grandfather and maternal grandmother; Hyperlipidemia in her maternal grandfather and mother; Hypertension in her father, maternal grandfather, maternal grandmother, and mother; Stroke in her maternal grandmother. No Known Allergies    Outpatient Encounter Medications as of 05/11/2020  Medication Sig  . albuterol (VENTOLIN HFA) 108 (90 Base) MCG/ACT inhaler Inhale 2  puffs into the lungs every 6 (six) hours as needed for wheezing or shortness of breath.  . famotidine (PEPCID AC) 10 MG chewable tablet Chew 10 mg by mouth 2 (two) times daily.  Marland Kitchen ibuprofen (ADVIL,MOTRIN) 800 MG tablet Take 1 tablet (800 mg total) by mouth every 8 (eight) hours.   No facility-administered encounter medications on file as of 05/11/2020.     REVIEW OF SYSTEMS: All other systems reviewed and negative except where noted in the History of Present Illness.   PHYSICAL EXAM: There were no vitals taken for this visit. General: Well developed ... in no acute distress. Head: Normocephalic and atraumatic. Eyes:  Sclerae non-icteric, conjunctive pink. Ears: Normal auditory acuity. Mouth: Dentition intact. No ulcers or lesions.  Neck: Supple, no lymphadenopathy or thyromegaly.  Lungs: Clear bilaterally to auscultation without wheezes, crackles or rhonchi. Heart: Regular rate and rhythm. No murmur, rub or gallop appreciated.  Abdomen: Soft, nontender, non distended. No masses. No hepatosplenomegaly. Normoactive bowel sounds x 4 quadrants.  Rectal:  Musculoskeletal: Symmetrical with no gross deformities. Skin: Warm and dry. No rash or lesions on visible extremities. Extremities: No edema. Neurological: Alert oriented x 4, no focal deficits.  Psychological:  Alert and cooperative. Normal mood and affect.  ASSESSMENT AND PLAN:    CC:  Deeann Saint, MD

## 2020-05-11 ENCOUNTER — Ambulatory Visit: Payer: BC Managed Care – PPO | Admitting: Nurse Practitioner

## 2021-03-09 DIAGNOSIS — N898 Other specified noninflammatory disorders of vagina: Secondary | ICD-10-CM | POA: Diagnosis not present

## 2021-03-09 DIAGNOSIS — N920 Excessive and frequent menstruation with regular cycle: Secondary | ICD-10-CM | POA: Diagnosis not present

## 2021-03-09 DIAGNOSIS — J45909 Unspecified asthma, uncomplicated: Secondary | ICD-10-CM | POA: Diagnosis not present

## 2021-03-09 DIAGNOSIS — Z124 Encounter for screening for malignant neoplasm of cervix: Secondary | ICD-10-CM | POA: Diagnosis not present

## 2021-03-26 DIAGNOSIS — B373 Candidiasis of vulva and vagina: Secondary | ICD-10-CM | POA: Diagnosis not present

## 2021-05-20 DIAGNOSIS — R197 Diarrhea, unspecified: Secondary | ICD-10-CM | POA: Diagnosis not present

## 2021-05-20 DIAGNOSIS — R1111 Vomiting without nausea: Secondary | ICD-10-CM | POA: Diagnosis not present

## 2021-06-12 DIAGNOSIS — R109 Unspecified abdominal pain: Secondary | ICD-10-CM | POA: Diagnosis not present

## 2021-06-12 DIAGNOSIS — R197 Diarrhea, unspecified: Secondary | ICD-10-CM | POA: Diagnosis not present

## 2021-06-12 DIAGNOSIS — A09 Infectious gastroenteritis and colitis, unspecified: Secondary | ICD-10-CM | POA: Diagnosis not present

## 2021-06-12 DIAGNOSIS — R519 Headache, unspecified: Secondary | ICD-10-CM | POA: Diagnosis not present

## 2021-07-10 DIAGNOSIS — J069 Acute upper respiratory infection, unspecified: Secondary | ICD-10-CM | POA: Diagnosis not present

## 2021-07-10 DIAGNOSIS — U071 COVID-19: Secondary | ICD-10-CM | POA: Diagnosis not present

## 2021-09-21 DIAGNOSIS — M1712 Unilateral primary osteoarthritis, left knee: Secondary | ICD-10-CM | POA: Diagnosis not present

## 2021-10-12 DIAGNOSIS — R112 Nausea with vomiting, unspecified: Secondary | ICD-10-CM | POA: Diagnosis not present

## 2021-10-12 DIAGNOSIS — R110 Nausea: Secondary | ICD-10-CM | POA: Diagnosis not present

## 2021-11-07 DIAGNOSIS — M1711 Unilateral primary osteoarthritis, right knee: Secondary | ICD-10-CM | POA: Diagnosis not present

## 2022-01-08 DIAGNOSIS — N760 Acute vaginitis: Secondary | ICD-10-CM | POA: Diagnosis not present

## 2022-01-09 DIAGNOSIS — N760 Acute vaginitis: Secondary | ICD-10-CM | POA: Diagnosis not present

## 2022-02-01 DIAGNOSIS — R519 Headache, unspecified: Secondary | ICD-10-CM | POA: Diagnosis not present

## 2022-02-07 DIAGNOSIS — R519 Headache, unspecified: Secondary | ICD-10-CM | POA: Diagnosis not present

## 2022-02-15 ENCOUNTER — Encounter: Payer: Self-pay | Admitting: Emergency Medicine

## 2022-02-15 ENCOUNTER — Ambulatory Visit
Admission: EM | Admit: 2022-02-15 | Discharge: 2022-02-15 | Disposition: A | Discharge status: Home / Self Care | Payer: Medicaid Other | Attending: Internal Medicine | Admitting: Internal Medicine

## 2022-02-15 DIAGNOSIS — Z23 Encounter for immunization: Secondary | ICD-10-CM | POA: Diagnosis not present

## 2022-02-15 DIAGNOSIS — S61210A Laceration without foreign body of right index finger without damage to nail, initial encounter: Secondary | ICD-10-CM

## 2022-02-15 MED ORDER — TETANUS-DIPHTH-ACELL PERTUSSIS 5-2.5-18.5 LF-MCG/0.5 IM SUSY
0.5000 mL | PREFILLED_SYRINGE | Freq: Once | INTRAMUSCULAR | Status: AC
  Administered 2022-02-15: 0.5 mL via INTRAMUSCULAR

## 2022-02-15 NOTE — Discharge Instructions (Signed)
Dressing has been applied to your finger.  Please monitor for signs of infection.  Keep it covered until healed.  Follow-up if infection occurs or if symptoms persist.

## 2022-02-15 NOTE — ED Triage Notes (Signed)
Patient cut right pointer finger on glass yesterday. Finger swollen and tender today. Reports she's unsure of last tetanus shot.
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
## 2022-02-16 ENCOUNTER — Ambulatory Visit
Admission: EM | Admit: 2022-02-16 | Discharge: 2022-02-16 | Disposition: A | Discharge status: Home / Self Care | Payer: Medicaid Other

## 2022-02-16 ENCOUNTER — Other Ambulatory Visit: Payer: Self-pay

## 2022-02-16 DIAGNOSIS — S61210D Laceration without foreign body of right index finger without damage to nail, subsequent encounter: Secondary | ICD-10-CM

## 2022-02-16 NOTE — ED Triage Notes (Signed)
Pt here for rn visit to place band aid on finger. Band aid applied without complication. Education provided.

## 2022-03-02 DIAGNOSIS — B3731 Acute candidiasis of vulva and vagina: Secondary | ICD-10-CM | POA: Diagnosis not present

## 2022-03-22 DIAGNOSIS — N760 Acute vaginitis: Secondary | ICD-10-CM | POA: Diagnosis not present

## 2022-03-22 DIAGNOSIS — Z202 Contact with and (suspected) exposure to infections with a predominantly sexual mode of transmission: Secondary | ICD-10-CM | POA: Diagnosis not present

## 2022-03-22 DIAGNOSIS — Z8742 Personal history of other diseases of the female genital tract: Secondary | ICD-10-CM | POA: Diagnosis not present

## 2022-03-22 DIAGNOSIS — Z1389 Encounter for screening for other disorder: Secondary | ICD-10-CM | POA: Diagnosis not present

## 2022-03-22 DIAGNOSIS — Z6841 Body Mass Index (BMI) 40.0 and over, adult: Secondary | ICD-10-CM | POA: Diagnosis not present

## 2022-03-22 DIAGNOSIS — Z01411 Encounter for gynecological examination (general) (routine) with abnormal findings: Secondary | ICD-10-CM | POA: Diagnosis not present

## 2022-04-06 ENCOUNTER — Ambulatory Visit: Payer: Self-pay

## 2022-04-08 ENCOUNTER — Ambulatory Visit
Admission: EM | Admit: 2022-04-08 | Discharge: 2022-04-08 | Disposition: A | Discharge status: Home / Self Care | Payer: Medicaid Other | Attending: Student | Admitting: Student

## 2022-04-08 ENCOUNTER — Encounter: Payer: Self-pay | Admitting: Emergency Medicine

## 2022-04-08 ENCOUNTER — Ambulatory Visit: Admit: 2022-04-08 | Payer: Medicaid Other

## 2022-04-08 ENCOUNTER — Other Ambulatory Visit: Payer: Self-pay

## 2022-04-08 DIAGNOSIS — Z202 Contact with and (suspected) exposure to infections with a predominantly sexual mode of transmission: Secondary | ICD-10-CM | POA: Diagnosis not present

## 2022-04-08 DIAGNOSIS — Z113 Encounter for screening for infections with a predominantly sexual mode of transmission: Secondary | ICD-10-CM | POA: Insufficient documentation

## 2022-04-08 LAB — POCT URINE PREGNANCY: Preg Test, Ur: NEGATIVE

## 2022-04-08 NOTE — ED Provider Notes (Signed)
?[facility] ? ? ? ?CSN: [id number] ?Arrival date & time: 04/08/22  1414 ? ? ?  ? ?History   ?Chief Complaint ?Chief Complaint  ?Patient presents with  ? Exposure to STD  ? ? ?HPI ?Kara DiesJaCura Darrielle Maudry DiegoSturdivant is a 32 y.o. female presenting for STI screening, she is currently asymptomatic.  States she is unsure if her boyfriend is being faithful, and so wants to get screened. Denies hematuria, dysuria, frequency, urgency, back pain, n/v/d/abd pain, fevers/chills, abdnormal vaginal discharge.  ? ? ?HPI ? ?Past Medical History:  ?Diagnosis Date  ? Anemia   ? Asthma   ? Environmental allergies   ? IBS (irritable bowel syndrome)   ? Migraine   ? S/P cesarean section 12/09/2017  ? Trichomonas infection   ? UTI (urinary tract infection)   ? ? ?Patient Active Problem List  ? Diagnosis Date Noted  ? Personal history of COVID-19 02/19/2020  ? History of migraine 06/16/2018  ? Class 3 severe obesity due to excess calories without serious comorbidity with body mass index (BMI) of 50.0 to 59.9 in adult [facility](HCC) 06/16/2018  ? Irritable bowel syndrome with both constipation and diarrhea 06/16/2018  ? Seasonal allergies 06/16/2018  ? PIH (pregnancy induced hypertension) 12/09/2017  ? S/P cesarean section 12/09/2017  ? TINEA VERSICOLOR 09/07/2010  ? KNEE PAIN, LEFT 12/09/2009  ? INTERTRIGO 11/11/2009  ? ANKLE PAIN, LEFT 11/11/2009  ? OBESITY, MORBID 08/03/2009  ? MIGRAINE, COMMON 08/03/2009  ? ALLERGIC RHINITIS, SEASONAL 08/03/2009  ? EXERCISE INDUCED ASTHMA 08/03/2009  ? ? ?Past Surgical History:  ?Procedure Laterality Date  ? CESAREAN SECTION N/A 12/09/2017  ? Procedure: CESAREAN SECTION;  Surgeon: Sherian ReinBovard-Stuckert, Jody, MD;  Location: [facility];  Service: Obstetrics;  Laterality: N/A;  ? NO PAST SURGERIES    ? ? ?OB History   ? ? Gravida  ?1  ? Para  ?   ? Term  ?   ? Preterm  ?   ? AB  ?   ? Living  ?   ?  ? ? SAB  ?   ? IAB  ?   ? Ectopic  ?   ? Multiple  ?   ? Live Births  ?   ?   ?  ?  ? ? ? ?Home Medications    ? ?Prior to Admission medications   ?Medication Sig Start Date End Date Taking? Authorizing Provider  ?albuterol (VENTOLIN HFA) 108 (90 Base) MCG/ACT inhaler Inhale 2 puffs into the lungs every 6 (six) hours as needed for wheezing or shortness of breath. 02/19/20   Deeann SaintBanks, Shannon R, MD  ?famotidine (PEPCID AC) 10 MG chewable tablet Chew 10 mg by mouth 2 (two) times daily.    [provider]  ?ibuprofen (ADVIL,MOTRIN) 800 MG tablet Take 1 tablet (800 mg total) by mouth every 8 (eight) hours. 12/12/17   Meisinger, Tawanna Coolerodd, MD  ? ? ?Family History ?Family History  ?Problem Relation Age of Onset  ? Asthma Mother   ? Depression Mother   ? Hyperlipidemia Mother   ? Hypertension Mother   ? Hypertension Father   ? Diabetes Father   ? Arthritis Maternal Grandmother   ? COPD Maternal Grandmother   ? Depression Maternal Grandmother   ? Diabetes Maternal Grandmother   ? Heart attack Maternal Grandmother   ? Heart disease Maternal Grandmother   ? Hypertension Maternal Grandmother   ? Stroke Maternal Grandmother   ? Hypertension Maternal Grandfather   ? Hyperlipidemia Maternal Grandfather   ?  Heart disease Maternal Grandfather   ? Diabetes Maternal Grandfather   ? Birth defects Maternal Grandfather   ? Asthma Maternal Grandfather   ? Arthritis Maternal Grandfather   ? Arthritis Paternal Grandmother   ? Arthritis Paternal Grandfather   ? ? ?Social History ?Social History  ? ?Tobacco Use  ? Smoking status: Never  ? Smokeless tobacco: Never  ?Substance Use Topics  ? Alcohol use: No  ? Drug use: No  ? ? ? ?Allergies   ?Patient has no known allergies. ? ? ?Review of Systems ?Review of Systems  ?Constitutional:  Negative for chills and fever.  ?HENT:  Negative for sore throat.   ?Eyes:  Negative for pain and redness.  ?Respiratory:  Negative for shortness of breath.   ?Cardiovascular:  Negative for chest pain.  ?Gastrointestinal:  Negative for abdominal pain, diarrhea, nausea and vomiting.  ?Genitourinary:  Negative for decreased  urine volume, difficulty urinating, dysuria, flank pain, frequency, genital sores, hematuria and urgency.  ?Musculoskeletal:  Negative for back pain.  ?Skin:  Negative for rash.  ?All other systems reviewed and are negative. ? ? ?Physical Exam ?Triage Vital Signs ?ED Triage Vitals  ?Enc Vitals Group  ?   BP 04/08/22 1513 107/71  ?   Pulse Rate 04/08/22 1513 79  ?   Resp 04/08/22 1513 18  ?   Temp 04/08/22 1513 97.8 ?F (36.6 ?C)  ?   Temp Source 04/08/22 1513 Oral  ?   SpO2 04/08/22 1513 98 %  ?   Weight --   ?   Height --   ?   Head Circumference --   ?   Peak Flow --   ?   Pain Score 04/08/22 1514 0  ?   Pain Loc --   ?   Pain Edu? --   ?   Excl. in GC? --   ? ?No data found. ? ?Updated Vital Signs ?BP 107/71 (BP Location: Left Arm)   Pulse 79   Temp 97.8 ?F (36.6 ?C) (Oral)   Resp 18   SpO2 98%  ? ?Visual Acuity ?Right Eye Distance:   ?Left Eye Distance:   ?Bilateral Distance:   ? ?Right Eye Near:   ?Left Eye Near:    ?Bilateral Near:    ? ?Physical Exam ?Vitals reviewed.  ?Constitutional:   ?   General: She is not in acute distress. ?   Appearance: Normal appearance. She is not ill-appearing.  ?HENT:  ?   Head: Normocephalic and atraumatic.  ?   Mouth/Throat:  ?   Mouth: Mucous membranes are moist.  ?   Comments: Moist mucous membranes ?Eyes:  ?   Extraocular Movements: Extraocular movements intact.  ?   Pupils: Pupils are equal, round, and reactive to light.  ?Cardiovascular:  ?   Rate and Rhythm: Normal rate and regular rhythm.  ?   Heart sounds: Normal heart sounds.  ?Pulmonary:  ?   Effort: Pulmonary effort is normal.  ?   Breath sounds: Normal breath sounds. No wheezing, rhonchi or rales.  ?Abdominal:  ?   General: Bowel sounds are normal. There is no distension.  ?   Palpations: Abdomen is soft. There is no mass.  ?   Tenderness: There is no abdominal tenderness. There is no right CVA tenderness, left CVA tenderness, guarding or rebound.  ?Skin: ?   General: Skin is warm.  ?   Capillary Refill:  Capillary refill takes less than 2 seconds.  ?   Comments:  Good skin turgor  ?Neurological:  ?   General: No focal deficit present.  ?   Mental Status: She is alert and oriented to person, place, and time.  ?Psychiatric:     ?   Mood and Affect: Mood normal.     ?   Behavior: Behavior normal.  ? ? ? ?UC Treatments / Results  ?Labs ?(all labs ordered are listed, but only abnormal results are displayed) ?Labs Reviewed  ?POCT URINE PREGNANCY  ?CERVICOVAGINAL ANCILLARY ONLY  ? ? ?EKG ? ? ?Radiology ?No results found. ? ?Procedures ?Procedures (including critical care time) ? ?Medications Ordered in UC ?Medications - No data to display ? ?Initial Impression / Assessment and Plan / UC Course  ?I have reviewed the triage vital signs and the nursing notes. ? ?Pertinent labs & imaging results that were available during my care of the patient were reviewed by me and considered in my medical decision making (see chart for details). ? ?  ? ?This patient is a very pleasant 32 y.o. year old female presenting with STI screen - asymptomatic. Afebrile, nontachycardic, no reproducible abd pain or CVAT. ? ?U-preg negative. ?Will send self-swab for G/C, trich, yeast, BV testing. Declines HIV, RPR. Safe sex precautions.  ? ? ?ED return precautions discussed. Patient verbalizes understanding and agreement.  ? ? ?Final Clinical Impressions(s) / UC Diagnoses  ? ?Final diagnoses:  ?Routine screening for STI (sexually transmitted infection)  ? ? ? ?Discharge Instructions   ? ?  ?declines avs ? ? ? ? ?ED Prescriptions   ?None ?  ? ?PDMP not reviewed this encounter. ?  ?Rhys Martini, PA-C ?04/08/22 1528 ? ?

## 2022-04-08 NOTE — Discharge Instructions (Addendum)
declines avs  ?

## 2022-04-08 NOTE — ED Triage Notes (Signed)
Pt here for STD testing; unsure if having sx  ?

## 2022-04-10 ENCOUNTER — Telehealth (HOSPITAL_COMMUNITY): Payer: Self-pay | Admitting: Emergency Medicine

## 2022-04-10 LAB — CERVICOVAGINAL ANCILLARY ONLY
Bacterial Vaginitis (gardnerella): POSITIVE — AB
Candida Glabrata: NEGATIVE
Candida Vaginitis: NEGATIVE
Chlamydia: NEGATIVE
Comment: NEGATIVE
Comment: NEGATIVE
Comment: NEGATIVE
Comment: NEGATIVE
Comment: NEGATIVE
Comment: NORMAL
Neisseria Gonorrhea: NEGATIVE
Trichomonas: NEGATIVE

## 2022-04-10 MED ORDER — METRONIDAZOLE 500 MG PO TABS: 500.0000 mg | ORAL_TABLET | Freq: Two times a day (BID) | ORAL | 0 refills | Status: DC

## 2022-04-11 ENCOUNTER — Telehealth (HOSPITAL_COMMUNITY): Payer: Self-pay | Admitting: Emergency Medicine

## 2022-04-11 MED ORDER — FLUCONAZOLE 150 MG PO TABS: 150.0000 mg | ORAL_TABLET | Freq: Once | ORAL | 0 refills | Status: AC

## 2022-04-11 NOTE — Telephone Encounter (Signed)
Patient requested diflucan prescription for antibiotic associated yeast, sent per protocol.  ?

## 2022-11-16 ENCOUNTER — Encounter (HOSPITAL_BASED_OUTPATIENT_CLINIC_OR_DEPARTMENT_OTHER): Payer: Self-pay

## 2022-11-16 ENCOUNTER — Emergency Department (HOSPITAL_BASED_OUTPATIENT_CLINIC_OR_DEPARTMENT_OTHER)
Admission: EM | Admit: 2022-11-16 | Discharge: 2022-11-16 | Disposition: A | Discharge status: Home / Self Care | Payer: Medicaid Other | Attending: Emergency Medicine | Admitting: Emergency Medicine

## 2022-11-16 ENCOUNTER — Ambulatory Visit
Admission: EM | Admit: 2022-11-16 | Discharge: 2022-11-16 | Disposition: A | Discharge status: Other Acute Inpatient Hospital | Payer: Medicaid Other

## 2022-11-16 DIAGNOSIS — Z7951 Long term (current) use of inhaled steroids: Secondary | ICD-10-CM | POA: Insufficient documentation

## 2022-11-16 DIAGNOSIS — J45909 Unspecified asthma, uncomplicated: Secondary | ICD-10-CM | POA: Insufficient documentation

## 2022-11-16 DIAGNOSIS — K625 Hemorrhage of anus and rectum: Secondary | ICD-10-CM | POA: Insufficient documentation

## 2022-11-16 LAB — CBC WITH DIFFERENTIAL/PLATELET
Abs Immature Granulocytes: 0.02 10*3/uL (ref 0.00–0.07)
Basophils Absolute: 0.1 10*3/uL (ref 0.0–0.1)
Basophils Relative: 1 %
Eosinophils Absolute: 0.3 10*3/uL (ref 0.0–0.5)
Eosinophils Relative: 3 %
HCT: 40.1 % (ref 36.0–46.0)
Hemoglobin: 12.5 g/dL (ref 12.0–15.0)
Immature Granulocytes: 0 %
Lymphocytes Relative: 36 %
Lymphs Abs: 3.2 10*3/uL (ref 0.7–4.0)
MCH: 24.2 pg — ABNORMAL LOW (ref 26.0–34.0)
MCHC: 31.2 g/dL (ref 30.0–36.0)
MCV: 77.6 fL — ABNORMAL LOW (ref 80.0–100.0)
Monocytes Absolute: 0.6 10*3/uL (ref 0.1–1.0)
Monocytes Relative: 7 %
Neutro Abs: 4.8 10*3/uL (ref 1.7–7.7)
Neutrophils Relative %: 53 %
Platelets: 328 10*3/uL (ref 150–400)
RBC: 5.17 MIL/uL — ABNORMAL HIGH (ref 3.87–5.11)
RDW: 14.6 % (ref 11.5–15.5)
WBC: 9 10*3/uL (ref 4.0–10.5)
nRBC: 0 % (ref 0.0–0.2)

## 2022-11-16 LAB — COMPREHENSIVE METABOLIC PANEL
ALT: 16 U/L (ref 0–44)
AST: 14 U/L — ABNORMAL LOW (ref 15–41)
Albumin: 4 g/dL (ref 3.5–5.0)
Alkaline Phosphatase: 45 U/L (ref 38–126)
Anion gap: 9 (ref 5–15)
BUN: 9 mg/dL (ref 6–20)
CO2: 24 mmol/L (ref 22–32)
Calcium: 9 mg/dL (ref 8.9–10.3)
Chloride: 106 mmol/L (ref 98–111)
Creatinine, Ser: 0.75 mg/dL (ref 0.44–1.00)
GFR, Estimated: >60 mL/min (ref >60)
Glucose, Bld: 90 mg/dL (ref 70–99)
Potassium: 4 mmol/L (ref 3.5–5.1)
Sodium: 139 mmol/L (ref 135–145)
Total Bilirubin: 0.3 mg/dL (ref 0.3–1.2)
Total Protein: 7.5 g/dL (ref 6.5–8.1)

## 2022-11-16 LAB — OCCULT BLOOD X 1 CARD TO LAB, STOOL: Fecal Occult Bld: NEGATIVE

## 2022-11-16 MED ORDER — NAPROXEN 500 MG PO TABS: 500.0000 mg | ORAL_TABLET | Freq: Two times a day (BID) | ORAL | 0 refills | Status: DC

## 2022-11-16 MED ORDER — DOCUSATE SODIUM 100 MG PO CAPS: 100.0000 mg | ORAL_CAPSULE | Freq: Two times a day (BID) | ORAL | 0 refills | Status: AC

## 2022-11-16 MED ORDER — DOCUSATE SODIUM 100 MG PO CAPS: 100.0000 mg | ORAL_CAPSULE | Freq: Two times a day (BID) | ORAL | 0 refills | Status: DC

## 2022-11-16 NOTE — ED Triage Notes (Signed)
Pt presents to uc with co of rectal bleeding for about 1 hour and discomfort in her rectum reports normal bm this morning and no straining.

## 2022-11-16 NOTE — ED Provider Notes (Signed)
[facility]    CSN: [id number] Arrival date & time: 11/16/22  1551      History   Chief Complaint Chief Complaint  Patient presents with   Rectal Bleeding    HPI Sandra Logan is a 32 y.o. female.   Patient presents with concerns for rectal bleeding that started about an hour ago.  She reports that she has been having some bright red blood that was occurring when she was having a bowel movement earlier.  She reports that she also has some abdominal discomfort but is very worried about what is going on so that may be attributed to that.  She reports it is generalized cramping.  Patient reports that she has been having a lot of diarrhea but this is normal when she is on her menstrual cycle and states that she just came off her menstrual cycle.  Denies nausea, vomiting, fever.  Patient reports that she does have some discomfort in her rectum but it is not painful.   Rectal Bleeding   Past Medical History:  Diagnosis Date   Anemia    Asthma    Environmental allergies    IBS (irritable bowel syndrome)    Migraine    S/P cesarean section 12/09/2017   Trichomonas infection    UTI (urinary tract infection)     Patient Active Problem List   Diagnosis Date Noted   Personal history of COVID-19 02/19/2020   History of migraine 06/16/2018   Class 3 severe obesity due to excess calories without serious comorbidity with body mass index (BMI) of 50.0 to 59.9 in adult (HCC) 06/16/2018   Irritable bowel syndrome with both constipation and diarrhea 06/16/2018   Seasonal allergies 06/16/2018   PIH (pregnancy induced hypertension) 12/09/2017   S/P cesarean section 12/09/2017   TINEA VERSICOLOR 09/07/2010   KNEE PAIN, LEFT 12/09/2009   INTERTRIGO 11/11/2009   ANKLE PAIN, LEFT 11/11/2009   OBESITY, MORBID 08/03/2009   MIGRAINE, COMMON 08/03/2009   ALLERGIC RHINITIS, SEASONAL 08/03/2009   EXERCISE INDUCED ASTHMA 08/03/2009    Past Surgical History:   Procedure Laterality Date   CESAREAN SECTION N/A 12/09/2017   Procedure: CESAREAN SECTION;  Surgeon: Sherian Rein, MD;  Location: [facility];  Service: Obstetrics;  Laterality: N/A;   NO PAST SURGERIES      OB History     Gravida  1   Para      Term      Preterm      AB      Living         SAB      IAB      Ectopic      Multiple      Live Births               Home Medications    Prior to Admission medications   Medication Sig Start Date End Date Taking? Authorizing Provider  albuterol (VENTOLIN HFA) 108 (90 Base) MCG/ACT inhaler Inhale 2 puffs into the lungs every 6 (six) hours as needed for wheezing or shortness of breath. 02/19/20   Deeann Saint, MD  famotidine (PEPCID AC) 10 MG chewable tablet Chew 10 mg by mouth 2 (two) times daily.    [provider]  ibuprofen (ADVIL,MOTRIN) 800 MG tablet Take 1 tablet (800 mg total) by mouth every 8 (eight) hours. 12/12/17   Meisinger, Tawanna Cooler, MD  metroNIDAZOLE (FLAGYL) 500 MG tablet Take 1 tablet (500 mg total) by mouth 2 (two) times  daily. 04/10/22   Lamptey, Britta Mccreedy, MD    Family History Family History  Problem Relation Age of Onset   Asthma Mother    Depression Mother    Hyperlipidemia Mother    Hypertension Mother    Hypertension Father    Diabetes Father    Arthritis Maternal Grandmother    COPD Maternal Grandmother    Depression Maternal Grandmother    Diabetes Maternal Grandmother    Heart attack Maternal Grandmother    Heart disease Maternal Grandmother    Hypertension Maternal Grandmother    Stroke Maternal Grandmother    Hypertension Maternal Grandfather    Hyperlipidemia Maternal Grandfather    Heart disease Maternal Grandfather    Diabetes Maternal Grandfather    Birth defects Maternal Grandfather    Asthma Maternal Grandfather    Arthritis Maternal Grandfather    Arthritis Paternal Grandmother    Arthritis Paternal Grandfather     Social History Social  History   Tobacco Use   Smoking status: Never   Smokeless tobacco: Never  Substance Use Topics   Alcohol use: No   Drug use: No     Allergies   Patient has no known allergies.   Review of Systems Review of Systems  Gastrointestinal:  Positive for hematochezia.  Per HPI   Physical Exam Triage Vital Signs ED Triage Vitals [11/16/22 1605]  Enc Vitals Group     BP (!) 164/92     Pulse Rate 83     Resp 19     Temp 97.8 F (36.6 C)     Temp src      SpO2 98 %     Weight      Height      Head Circumference      Peak Flow      Pain Score 0     Pain Loc      Pain Edu?      Excl. in GC?    No data found.  Updated Vital Signs BP (!) 164/92   Pulse 83   Temp 97.8 F (36.6 C)   Resp 19   SpO2 98%   Visual Acuity Right Eye Distance:   Left Eye Distance:   Bilateral Distance:    Right Eye Near:   Left Eye Near:    Bilateral Near:     Physical Exam Exam conducted with a chaperone present.  Constitutional:      General: She is not in acute distress.    Appearance: Normal appearance. She is not toxic-appearing or diaphoretic.  HENT:     Head: Normocephalic and atraumatic.  Eyes:     Extraocular Movements: Extraocular movements intact.     Conjunctiva/sclera: Conjunctivae normal.  Pulmonary:     Effort: Pulmonary effort is normal.  Genitourinary:    Rectum: No anal fissure, external hemorrhoid or internal hemorrhoid.     Comments: There are no obvious abnormalities to external rectum noted on exam.  On digital exam, there is no blood noted and no obvious internal hemorrhoid.  No obvious abscess either. Neurological:     General: No focal deficit present.     Mental Status: She is alert and oriented to person, place, and time. Mental status is at baseline.  Psychiatric:        Mood and Affect: Mood normal.        Behavior: Behavior normal.        Thought Content: Thought content normal.        Judgment:  Judgment normal.      UC Treatments / Results   Labs (all labs ordered are listed, but only abnormal results are displayed) Labs Reviewed - No data to display  EKG   Radiology No results found.  Procedures Procedures (including critical care time)  Medications Ordered in UC Medications - No data to display  Initial Impression / Assessment and Plan / UC Course  I have reviewed the triage vital signs and the nursing notes.  Pertinent labs & imaging results that were available during my care of the patient were reviewed by me and considered in my medical decision making (see chart for details).     There is no obvious hemorrhoid noted on physical exam that could be causing rectal bleeding.  Therefore, there is concern given patient has been experiencing some rectal bleeding and may need further work-up which cannot be provided here in urgent care.  Patient was advised to go to the emergency department for further evaluation and management as stat blood work may be necessary.  Patient was agreeable with plan.  Vital signs stable at discharge.  Agree with patient self transport to the hospital. Final Clinical Impressions(s) / UC Diagnoses   Final diagnoses:  Rectal bleeding     Discharge Instructions      Please go to the emergency department as soon as you leave urgent care for further evaluation and management.    ED Prescriptions   None    PDMP not reviewed this encounter.   Gustavus Bryant, [place] 11/16/22 1627

## 2022-11-16 NOTE — Discharge Instructions (Addendum)
Please take tylenol/ibuprofen for pain. I recommend close follow-up with gastroenterology for reevaluation.  Please do not hesitate to return to emergency department if worrisome signs symptoms we discussed become apparent.    

## 2022-11-16 NOTE — ED Provider Notes (Signed)
[facility] EMERGENCY DEPT Provider Note   CSN: [id number] Arrival date & time: 11/16/22  1738     History {Add pertinent medical, surgical, social history, OB history to HPI:1} Chief Complaint  Patient presents with   Rectal Bleeding    Sandra Logan is a 32 y.o. female   Rectal Bleeding      Home Medications Prior to Admission medications   Medication Sig Start Date End Date Taking? Authorizing Provider  naproxen (NAPROSYN) 500 MG tablet Take 1 tablet (500 mg total) by mouth 2 (two) times daily. 11/16/22  Yes Jeanelle Malling, PA  albuterol (VENTOLIN HFA) 108 (90 Base) MCG/ACT inhaler Inhale 2 puffs into the lungs every 6 (six) hours as needed for wheezing or shortness of breath. 02/19/20   Deeann Saint, MD  docusate sodium (COLACE) 100 MG capsule Take 1 capsule (100 mg total) by mouth every 12 (twelve) hours. 11/16/22   Jeanelle Malling, PA  famotidine (PEPCID AC) 10 MG chewable tablet Chew 10 mg by mouth 2 (two) times daily.    [provider]  ibuprofen (ADVIL,MOTRIN) 800 MG tablet Take 1 tablet (800 mg total) by mouth every 8 (eight) hours. 12/12/17   Meisinger, Tawanna Cooler, MD  metroNIDAZOLE (FLAGYL) 500 MG tablet Take 1 tablet (500 mg total) by mouth 2 (two) times daily. 04/10/22   Lamptey, Britta Mccreedy, MD      Allergies    Patient has no known allergies.    Review of Systems   Review of Systems  Gastrointestinal:  Positive for hematochezia.    Physical Exam Updated Vital Signs BP 134/75 (BP Location: Right Arm)   Pulse 75   Temp 98 F (36.7 C) (Oral)   Resp 16   Ht 5\' 10"  (1.778 m)   Wt (!) 180.5 kg   LMP 11/16/2022   SpO2 100%   BMI 57.11 kg/m  Physical Exam Vitals and nursing note reviewed.  Constitutional:      Appearance: Normal appearance.  HENT:     Head: Normocephalic and atraumatic.     Mouth/Throat:     Mouth: Mucous membranes are moist.  Eyes:     General: No scleral icterus. Cardiovascular:     Rate and Rhythm: Normal  rate and regular rhythm.     Pulses: Normal pulses.     Heart sounds: Normal heart sounds.  Pulmonary:     Effort: Pulmonary effort is normal.     Breath sounds: Normal breath sounds.  Abdominal:     General: Abdomen is flat.     Palpations: Abdomen is soft.     Tenderness: There is no abdominal tenderness.  Genitourinary:    Comments: Rectal exam was done with RN as chaperone.  No evidence of internal or external hemorrhoid.  No perianal laceration or fistula noted. Musculoskeletal:        General: No deformity.  Skin:    General: Skin is warm.     Findings: No rash.  Neurological:     General: No focal deficit present.     Mental Status: She is alert.  Psychiatric:        Mood and Affect: Mood normal.     ED Results / Procedures / Treatments   Labs (all labs ordered are listed, but only abnormal results are displayed) Labs Reviewed  COMPREHENSIVE METABOLIC PANEL - Abnormal; Notable for the following components:      Result Value   AST 14 (*)    All other components within normal limits  CBC WITH DIFFERENTIAL/PLATELET - Abnormal; Notable for the following components:   RBC 5.17 (*)    MCV 77.6 (*)    MCH 24.2 (*)    All other components within normal limits  OCCULT BLOOD X 1 CARD TO LAB, STOOL    EKG None  Radiology No results found.  Procedures Procedures  {Document cardiac monitor, telemetry assessment procedure when appropriate:1}  Medications Ordered in ED Medications - No data to display  ED Course/ Medical Decision Making/ A&P                           Medical Decision Making Amount and/or Complexity of Data Reviewed Labs: ordered.  Risk OTC drugs. Prescription drug management.   This patient presents to the ED for rectal bleeding, this involves an extensive number of treatment options, and is a complaint that carries with a high risk of complications and morbidity.  The differential diagnosis includes rectal laceration, fistula, internal  hemorrhoid, external hemorrhoid, perianal laceration.  This is not an exhaustive list.  Comorbidities that complicate the patient evaluation See HPI  Social determinants of health NA  Additional history obtained: Additional history obtained from EMR. External records from outside source obtained and review including prior labs  Cardiac monitoring/EKG: The patient was maintained on a cardiac monitor.  I personally reviewed and interpreted the cardiac monitor which showed an underlying rhythm of: Sinus rhythm.  Lab tests: I ordered and personally interpreted labs.  The pertinent results include: WBC unremarkable. Hbg unremarkable. Platelets unremarkable. No electrolyte abnormalities noted. BUN, creatinine unremarkable. LFT unremarkable.  Imaging studies:  Problem list/ ED course/ Critical interventions/ Medical management: HPI: See above Vital signs within normal range and stable throughout visit. Laboratory/imaging studies significant for: See above. On physical examination, patient is afebrile and appears in no acute distress.  Rectal exam with no evidence of internal or external hemorrhoid.  No perianal laceration noted.  Fecal occult blood negative.  CBC with no leukocytosis or evidence of anemia.  Patient's clinical presentations and laboratory/imaging studies are most concerned for rectal laceration. Advised patient to increase fiber intake, take Colace for stool softener.  Advised patient to follow-up with gastroenterology for further evaluation and management.  Return to the ER if new or worsening symptoms. Colace and naproxen ordered.  I have reviewed the patient home medicines and have made adjustments as needed.  Consultations obtained:  Disposition Continued outpatient therapy. Follow-up with gastroenterology recommended for reevaluation of symptoms. Treatment plan discussed with patient.  Pt acknowledged understanding was agreeable to the plan. Worrisome signs and  symptoms were discussed with patient, and patient acknowledged understanding to return to the ED if they noticed these signs and symptoms. Patient was stable upon discharge.   This chart was dictated using voice recognition software.  Despite best efforts to proofread,  errors can occur which can change the documentation meaning.    {Document critical care time when appropriate:1} {Document review of labs and clinical decision tools ie heart score, Chads2Vasc2 etc:1}  {Document your independent review of radiology images, and any outside records:1} {Document your discussion with family members, caretakers, and with consultants:1} {Document social determinants of health affecting pt's care:1} {Document your decision making why or why not admission, treatments were needed:1} Final Clinical Impression(s) / ED Diagnoses Final diagnoses:  Rectal bleeding    Rx / DC Orders ED Discharge Orders          Ordered    docusate sodium (COLACE) 100  MG capsule  Every 12 hours,   Status:  Discontinued        11/16/22 2215    docusate sodium (COLACE) 100 MG capsule  Every 12 hours        11/16/22 2216    naproxen (NAPROSYN) 500 MG tablet  2 times daily        11/16/22 2216

## 2022-11-16 NOTE — ED Notes (Signed)
Warm blanket given to pt and family. Pt is upset she has had to wait.

## 2022-11-16 NOTE — Discharge Instructions (Signed)
Please go to the emergency department as soon as you leave urgent care for further evaluation and management. ?

## 2022-11-16 NOTE — ED Triage Notes (Signed)
Onset today of bright red blood with wiping. States BM was normal  No blood in stool  Seen in UC and sent here.NAD

## 2022-11-16 NOTE — ED Notes (Signed)
Patient is being discharged from the Urgent Care and sent to the Emergency Department via pov . Per mound np, patient is in need of higher level of care due to potiental rectal bleeding  . Patient is aware and verbalizes understanding of plan of care.  Vitals:   11/16/22 1605  BP: (!) 164/92  Pulse: 83  Resp: 19  Temp: 97.8 F (36.6 C)  SpO2: 98%

## 2023-12-23 ENCOUNTER — Telehealth: Payer: Medicaid Other | Admitting: Nurse Practitioner

## 2023-12-23 DIAGNOSIS — T7840XA Allergy, unspecified, initial encounter: Secondary | ICD-10-CM

## 2023-12-23 MED ORDER — CETIRIZINE HCL 10 MG PO TABS: 10.0000 mg | ORAL_TABLET | Freq: Every day | ORAL | 2 refills | Status: DC

## 2023-12-23 MED ORDER — PREDNISONE 10 MG (21) PO TBPK: ORAL_TABLET | ORAL | 0 refills | Status: DC

## 2023-12-23 NOTE — Progress Notes (Signed)
Virtual Visit Consent   Sandra Logan, you are scheduled for a virtual visit with a [facility] provider today. Just as with appointments in the office, your consent must be obtained to participate. Your consent will be active for this visit and any virtual visit you may have with one of our providers in the next 365 days. If you have a MyChart account, a copy of this consent can be sent to you electronically.  As this is a virtual visit, video technology does not allow for your provider to perform a traditional examination. This may limit your provider's ability to fully assess your condition. If your provider identifies any concerns that need to be evaluated in person or the need to arrange testing (such as labs, EKG, etc.), we will make arrangements to do so. Although advances in technology are sophisticated, we cannot ensure that it will always work on either your end or our end. If the connection with a video visit is poor, the visit may have to be switched to a telephone visit. With either a video or telephone visit, we are not always able to ensure that we have a secure connection.  By engaging in this virtual visit, you consent to the provision of healthcare and authorize for your insurance to be billed (if applicable) for the services provided during this visit. Depending on your insurance coverage, you may receive a charge related to this service.  I need to obtain your verbal consent now. Are you willing to proceed with your visit today? Sandra Logan has provided verbal consent on 12/23/2023 for a virtual visit (video or telephone). Sandra Simas, FNP  Date: 12/23/2023 5:35 PM  Virtual Visit via Video Note   I, Sandra Logan, connected with  Sandra Logan  ([id number], 05-Mar-1990) on 12/23/23 at  5:45 PM EST by a video-enabled telemedicine application and verified that I am speaking with the correct person using two identifiers.  Location: Patient:  Virtual Visit Location Patient: Home Provider: Virtual Visit Location Provider: Home Office   I discussed the limitations of evaluation and management by telemedicine and the availability of in person appointments. The patient expressed understanding and agreed to proceed.    History of Present Illness: Sandra Logan is a 33 y.o. who identifies as a female who was assigned female at birth, and is being seen today for hives   She believes this happened after eating chinese food  She consumed the food 2 days ago   She had [employer] and about 15 minutes after eating that she started to have abdominal itching  No meat - possible oyster in sauce   By the next morning she had itching from behind her ears to chest   She took a benadryl last night  She repeated benadryl earlier today as well   No oral symptoms   This is the first time she has had this happen with chinese food Other allergies include: bananas   Denies pregnancy   Problems:  Patient Active Problem List   Diagnosis Date Noted   Personal history of COVID-19 02/19/2020   History of migraine 06/16/2018   Class 3 severe obesity due to excess calories without serious comorbidity with body mass index (BMI) of 50.0 to 59.9 in adult (HCC) 06/16/2018   Irritable bowel syndrome with both constipation and diarrhea 06/16/2018   Seasonal allergies 06/16/2018   PIH (pregnancy induced hypertension) 12/09/2017   S/P cesarean section 12/09/2017   TINEA VERSICOLOR 09/07/2010   KNEE PAIN,  LEFT 12/09/2009   INTERTRIGO 11/11/2009   ANKLE PAIN, LEFT 11/11/2009   OBESITY, MORBID 08/03/2009   MIGRAINE, COMMON 08/03/2009   ALLERGIC RHINITIS, SEASONAL 08/03/2009   EXERCISE INDUCED ASTHMA 08/03/2009    Allergies: No Known Allergies Medications:  Current Outpatient Medications:    albuterol (VENTOLIN HFA) 108 (90 Base) MCG/ACT inhaler, Inhale 2 puffs into the lungs every 6 (six) hours as needed for wheezing or shortness of  breath., Disp: 6.7 g, Rfl: 3   docusate sodium (COLACE) 100 MG capsule, Take 1 capsule (100 mg total) by mouth every 12 (twelve) hours., Disp: 30 capsule, Rfl: 0   famotidine (PEPCID AC) 10 MG chewable tablet, Chew 10 mg by mouth 2 (two) times daily., Disp: , Rfl:    ibuprofen (ADVIL,MOTRIN) 800 MG tablet, Take 1 tablet (800 mg total) by mouth every 8 (eight) hours., Disp: 30 tablet, Rfl: 0   metroNIDAZOLE (FLAGYL) 500 MG tablet, Take 1 tablet (500 mg total) by mouth 2 (two) times daily., Disp: 14 tablet, Rfl: 0   naproxen (NAPROSYN) 500 MG tablet, Take 1 tablet (500 mg total) by mouth 2 (two) times daily., Disp: 30 tablet, Rfl: 0  Observations/Objective: Patient is well-developed, well-nourished in no acute distress.  Resting comfortably  at home.  Head is normocephalic, atraumatic.  No labored breathing.  Speech is clear and coherent with logical content.  Patient is alert and oriented at baseline.    Assessment and Plan:  1. Allergic reaction, initial encounter (Primary)  Advised taking prednisone with food   - predniSONE (STERAPRED UNI-PAK 21 TAB) 10 MG (21) TBPK tablet; Take 6 tablets on day one, 5 on day two, 4 on day three, 3 on day four, 2 on day five, and 1 on day six. Take with food.  Dispense: 21 tablet; Refill: 0 - cetirizine (ZYRTEC ALLERGY) 10 MG tablet; Take 1 tablet (10 mg total) by mouth at bedtime.  Dispense: 30 tablet; Refill: 2     Follow Up Instructions: I discussed the assessment and treatment plan with the patient. The patient was provided an opportunity to ask questions and all were answered. The patient agreed with the plan and demonstrated an understanding of the instructions.  A copy of instructions were sent to the patient via MyChart unless otherwise noted below.    The patient was advised to call back or seek an in-person evaluation if the symptoms worsen or if the condition fails to improve as anticipated.    Sandra Simas, FNP

## 2024-01-09 ENCOUNTER — Telehealth: Payer: Medicaid Other

## 2024-01-09 ENCOUNTER — Telehealth: Payer: Medicaid Other | Admitting: Family Medicine

## 2024-01-09 DIAGNOSIS — N760 Acute vaginitis: Secondary | ICD-10-CM

## 2024-01-09 MED ORDER — FLUCONAZOLE 150 MG PO TABS: 150.0000 mg | ORAL_TABLET | ORAL | 0 refills | Status: AC

## 2024-01-09 MED ORDER — METRONIDAZOLE 500 MG PO TABS: 500.0000 mg | ORAL_TABLET | Freq: Two times a day (BID) | ORAL | 0 refills | Status: AC

## 2024-01-09 NOTE — Patient Instructions (Signed)
 Emilene Darrielle Suttles, thank you for joining Chiquita CHRISTELLA Barefoot, NP for today's virtual visit.  While this provider is not your primary care provider (PCP), if your PCP is located in our provider database this encounter information will be shared with them immediately following your visit.   A [facility] MyChart account gives you access to today's visit and all your visits, tests, and labs performed at [facility]  click here if you don't have a [facility] MyChart account or go to [email or web address]  Consent: (Patient) Aleese Darrielle Legore provided verbal consent for this virtual visit at the beginning of the encounter.  Current Medications:  Current Outpatient Medications:    fluconazole  (DIFLUCAN ) 150 MG tablet, Take 1 tablet (150 mg total) by mouth as directed. Repeat in 3 days as needed, Disp: 2 tablet, Rfl: 0   metroNIDAZOLE  (FLAGYL ) 500 MG tablet, Take 1 tablet (500 mg total) by mouth 2 (two) times daily for 7 days., Disp: 14 tablet, Rfl: 0   albuterol  (VENTOLIN  HFA) 108 (90 Base) MCG/ACT inhaler, Inhale 2 puffs into the lungs every 6 (six) hours as needed for wheezing or shortness of breath., Disp: 6.7 g, Rfl: 3   cetirizine  (ZYRTEC  ALLERGY) 10 MG tablet, Take 1 tablet (10 mg total) by mouth at bedtime., Disp: 30 tablet, Rfl: 2   docusate sodium  (COLACE) 100 MG capsule, Take 1 capsule (100 mg total) by mouth every 12 (twelve) hours., Disp: 30 capsule, Rfl: 0   famotidine (PEPCID AC) 10 MG chewable tablet, Chew 10 mg by mouth 2 (two) times daily., Disp: , Rfl:    ibuprofen  (ADVIL ,MOTRIN ) 800 MG tablet, Take 1 tablet (800 mg total) by mouth every 8 (eight) hours., Disp: 30 tablet, Rfl: 0   naproxen  (NAPROSYN ) 500 MG tablet, Take 1 tablet (500 mg total) by mouth 2 (two) times daily., Disp: 30 tablet, Rfl: 0   predniSONE  (STERAPRED UNI-PAK 21 TAB) 10 MG (21) TBPK tablet, Take 6 tablets on day one, 5 on day two, 4 on day three, 3 on day four, 2 on day five, and 1  on day six. Take with food., Disp: 21 tablet, Rfl: 0   Medications ordered in this encounter:  Meds ordered this encounter  Medications   metroNIDAZOLE  (FLAGYL ) 500 MG tablet    Sig: Take 1 tablet (500 mg total) by mouth 2 (two) times daily for 7 days.    Dispense:  14 tablet    Refill:  0    Supervising Provider:   BLAISE ALEENE KIDD [id number]   fluconazole  (DIFLUCAN ) 150 MG tablet    Sig: Take 1 tablet (150 mg total) by mouth as directed. Repeat in 3 days as needed    Dispense:  2 tablet    Refill:  0    Supervising Provider:   LAMPTEY, PHILIP O [[id number]]     *If you need refills on other medications prior to your next appointment, please contact your pharmacy*  Follow-Up: Call back or seek an in-person evaluation if the symptoms worsen or if the condition fails to improve as anticipated.  [facility] [phone]  Other Instructions  Healthy vaginal hygiene practices   -  Avoid sleeper pajamas. Nightgowns allow air to circulate.  Sleep without underpants whenever possible.  -  Wear cotton underpants during the day. Double-rinse underwear after washing to avoid residual irritants. Do not use fabric softeners for underwear and swimsuits.  - Avoid tights, leotards, leggings, skinny jeans, and other tight-fitting clothing. Skirts and loose-fitting pants  allow air to circulate.  - Avoid pantyliners.  Instead use tampons or cotton pads.  - Use the restroom after intercourse to help prevent UTI's  - Daily warm bathing is helpful:     - Soak in clean water (no soap) for 10 to 15 minutes. Adding vinegar or baking soda to the water has not been specifically studied and may not be better than clean water alone.      - Use soap to wash regions other than the genital area just before getting out of the tub. Limit use of any soap on genital areas. Use fragance-free soaps.     - Rinse the genital area well and gently pat dry.  Don't rub.  Hair dryer to assist with drying  can be used only if on cool setting.     - Do not use bubble baths or perfumed soaps.  - Do not use any feminine sprays, douches or powders.  These contain chemicals that will irritate the skin.  - If the genital area is tender or swollen, cool compresses may relieve the discomfort. Unscented wet wipes can be used instead of toilet paper for wiping.   - Emollients, such as Vaseline, may help protect skin and can be applied to the irritated area.  - Always remember to wipe front-to-back after bowel movements. Pat dry after urination.  - Do not sit in wet swimsuits for long periods of time after swimming    If you have been instructed to have an in-person evaluation today at a local Urgent Care facility, please use the link below. It will take you to a list of all of our available [facility], including address, phone number and hours of operation. Please do not delay care.  [facility]  If you or a family member do not have a primary care provider, use the link below to schedule a visit and establish care. When you choose a [facility] primary care physician or advanced practice provider, you gain a long-term partner in health. Find a Primary Care Provider  Learn more about [facility]'s in-office and virtual care options: [facility] - Get Care Now

## 2024-01-09 NOTE — Progress Notes (Signed)
 Virtual Visit Consent   Sasha Darrielle Berberich, you are scheduled for a virtual visit with a [facility] provider today. Just as with appointments in the office, your consent must be obtained to participate. Your consent will be active for this visit and any virtual visit you may have with one of our providers in the next 365 days. If you have a MyChart account, a copy of this consent can be sent to you electronically.  As this is a virtual visit, video technology does not allow for your provider to perform a traditional examination. This may limit your provider's ability to fully assess your condition. If your provider identifies any concerns that need to be evaluated in person or the need to arrange testing (such as labs, EKG, etc.), we will make arrangements to do so. Although advances in technology are sophisticated, we cannot ensure that it will always work on either your end or our end. If the connection with a video visit is poor, the visit may have to be switched to a telephone visit. With either a video or telephone visit, we are not always able to ensure that we have a secure connection.  By engaging in this virtual visit, you consent to the provision of healthcare and authorize for your insurance to be billed (if applicable) for the services provided during this visit. Depending on your insurance coverage, you may receive a charge related to this service.  I need to obtain your verbal consent now. Are you willing to proceed with your visit today? Rachyl Darrielle Deyoe has provided verbal consent on 01/09/2024 for a virtual visit (video or telephone). Chiquita CHRISTELLA Barefoot, NP  Date: 01/09/2024 1:30 PM  Virtual Visit via Video Note   I, Chiquita CHRISTELLA Barefoot, connected with  Kaytlan Behrman Dulmipcjwu  ([id number], Mar 19, 1990) on 01/09/24 at  1:30 PM EST by a video-enabled telemedicine application and verified that I am speaking with the correct person using two identifiers.  Location: Patient:  Virtual Visit Location Patient: Home Provider: Virtual Visit Location Provider: Home Office   I discussed the limitations of evaluation and management by telemedicine and the availability of in person appointments. The patient expressed understanding and agreed to proceed.    History of Present Illness: Mavery Darrielle Mccarey is a 34 y.o. who identifies as a female who was assigned female at birth, and is being seen today for vaginal itching  Onset was 4 days ago- irritation so tried to cleanse with a ph balanced soap but felt worse possibly due to wiping area.  Associated symptoms are vaginal itching, vaginal discharge- cloudy clump color, odor,  pressure in the pelvic area Modifying factors are miconazole cream 2 days with limited relief Denies fevers, chills, back pain, or UTI symptoms    Problems:  Patient Active Problem List   Diagnosis Date Noted   Personal history of COVID-19 02/19/2020   History of migraine 06/16/2018   Class 3 severe obesity due to excess calories without serious comorbidity with body mass index (BMI) of 50.0 to 59.9 in adult (HCC) 06/16/2018   Irritable bowel syndrome with both constipation and diarrhea 06/16/2018   Seasonal allergies 06/16/2018   PIH (pregnancy induced hypertension) 12/09/2017   S/P cesarean section 12/09/2017   TINEA VERSICOLOR 09/07/2010   KNEE PAIN, LEFT 12/09/2009   INTERTRIGO 11/11/2009   ANKLE PAIN, LEFT 11/11/2009   OBESITY, MORBID 08/03/2009   MIGRAINE, COMMON 08/03/2009   ALLERGIC RHINITIS, SEASONAL 08/03/2009   EXERCISE INDUCED ASTHMA 08/03/2009    Allergies: No  Known Allergies Medications:  Current Outpatient Medications:    albuterol  (VENTOLIN  HFA) 108 (90 Base) MCG/ACT inhaler, Inhale 2 puffs into the lungs every 6 (six) hours as needed for wheezing or shortness of breath., Disp: 6.7 g, Rfl: 3   cetirizine  (ZYRTEC  ALLERGY) 10 MG tablet, Take 1 tablet (10 mg total) by mouth at bedtime., Disp: 30 tablet, Rfl: 2    docusate sodium  (COLACE) 100 MG capsule, Take 1 capsule (100 mg total) by mouth every 12 (twelve) hours., Disp: 30 capsule, Rfl: 0   famotidine (PEPCID AC) 10 MG chewable tablet, Chew 10 mg by mouth 2 (two) times daily., Disp: , Rfl:    ibuprofen  (ADVIL ,MOTRIN ) 800 MG tablet, Take 1 tablet (800 mg total) by mouth every 8 (eight) hours., Disp: 30 tablet, Rfl: 0   metroNIDAZOLE  (FLAGYL ) 500 MG tablet, Take 1 tablet (500 mg total) by mouth 2 (two) times daily., Disp: 14 tablet, Rfl: 0   naproxen  (NAPROSYN ) 500 MG tablet, Take 1 tablet (500 mg total) by mouth 2 (two) times daily., Disp: 30 tablet, Rfl: 0   predniSONE  (STERAPRED UNI-PAK 21 TAB) 10 MG (21) TBPK tablet, Take 6 tablets on day one, 5 on day two, 4 on day three, 3 on day four, 2 on day five, and 1 on day six. Take with food., Disp: 21 tablet, Rfl: 0  Observations/Objective: Patient is well-developed, well-nourished in no acute distress.  Resting comfortably  at home.  Head is normocephalic, atraumatic.  No labored breathing.  Speech is clear and coherent with logical content.  Patient is alert and oriented at baseline.    Assessment and Plan:  1. Acute vaginitis (Primary)  - metroNIDAZOLE  (FLAGYL ) 500 MG tablet; Take 1 tablet (500 mg total) by mouth 2 (two) times daily for 7 days.  Dispense: 14 tablet; Refill: 0 - fluconazole  (DIFLUCAN ) 150 MG tablet; Take 1 tablet (150 mg total) by mouth as directed. Repeat in 3 days as needed  Dispense: 2 tablet; Refill: 0  -no other red flags  -complete medication as discussed -prevention discussed and on AVS   Reviewed side effects, risks and benefits of medication.    Patient acknowledged agreement and understanding of the plan.   Past Medical, Surgical, Social History, Allergies, and Medications have been Reviewed.   Follow Up Instructions: I discussed the assessment and treatment plan with the patient. The patient was provided an opportunity to ask questions and all were answered.  The patient agreed with the plan and demonstrated an understanding of the instructions.  A copy of instructions were sent to the patient via MyChart unless otherwise noted below.    The patient was advised to call back or seek an in-person evaluation if the symptoms worsen or if the condition fails to improve as anticipated.    Chiquita CHRISTELLA Barefoot, NP

## 2024-04-30 ENCOUNTER — Telehealth: Admitting: Physician Assistant

## 2024-04-30 DIAGNOSIS — L309 Dermatitis, unspecified: Secondary | ICD-10-CM

## 2024-04-30 MED ORDER — TRIAMCINOLONE ACETONIDE 0.1 % EX OINT: 1.0000 | TOPICAL_OINTMENT | Freq: Two times a day (BID) | CUTANEOUS | 1 refills | Status: AC

## 2024-04-30 NOTE — Progress Notes (Signed)
 Virtual Visit Consent   Sandra Logan, you are scheduled for a virtual visit with a [facility] provider today. Just as with appointments in the office, your consent must be obtained to participate. Your consent will be active for this visit and any virtual visit you may have with one of our providers in the next 365 days. If you have a MyChart account, a copy of this consent can be sent to you electronically.  As this is a virtual visit, video technology does not allow for your provider to perform a traditional examination. This may limit your provider's ability to fully assess your condition. If your provider identifies any concerns that need to be evaluated in person or the need to arrange testing (such as labs, EKG, etc.), we will make arrangements to do so. Although advances in technology are sophisticated, we cannot ensure that it will always work on either your end or our end. If the connection with a video visit is poor, the visit may have to be switched to a telephone visit. With either a video or telephone visit, we are not always able to ensure that we have a secure connection.  By engaging in this virtual visit, you consent to the provision of healthcare and authorize for your insurance to be billed (if applicable) for the services provided during this visit. Depending on your insurance coverage, you may receive a charge related to this service.  I need to obtain your verbal consent now. Are you willing to proceed with your visit today? Sandra Logan has provided verbal consent on 04/30/2024 for a virtual visit (video or telephone). Wanita Gutta, [place]  Date: 04/30/2024 5:53 PM   Virtual Visit via Video Note   I, Analyce Tavares, connected with  Sandra Logan  ([id number], [birth year]) on 04/30/24 at  5:45 PM EDT by a video-enabled telemedicine application and verified that I am speaking with the correct person using two identifiers.  Location: Patient:  Virtual Visit Location Patient: Home Provider: Virtual Visit Location Provider: Home Office   I discussed the limitations of evaluation and management by telemedicine and the availability of in person appointments. The patient expressed understanding and agreed to proceed.    History of Present Illness: Sandra Logan is a 34 y.o. who identifies as a female who was assigned female at birth, and is being seen today for rash.  HPI: 34 y/o F with h/o eczema presents via video telehealth for c/o rash on face and breast recently. Currently using old Rx for Triamcinalone ointment which has worked well for her and requesting another refill.   Rash    Problems:  Patient Active Problem List   Diagnosis Date Noted   Personal history of COVID-19 02/19/2020   History of migraine 06/16/2018   Class 3 severe obesity due to excess calories without serious comorbidity with body mass index (BMI) of 50.0 to 59.9 in adult 06/16/2018   Irritable bowel syndrome with both constipation and diarrhea 06/16/2018   Seasonal allergies 06/16/2018   PIH (pregnancy induced hypertension) 12/09/2017   S/P cesarean section 12/09/2017   TINEA VERSICOLOR 09/07/2010   KNEE PAIN, LEFT 12/09/2009   INTERTRIGO 11/11/2009   ANKLE PAIN, LEFT 11/11/2009   OBESITY, MORBID 08/03/2009   MIGRAINE, COMMON 08/03/2009   ALLERGIC RHINITIS, SEASONAL 08/03/2009   EXERCISE INDUCED ASTHMA 08/03/2009    Allergies: No Known Allergies Medications:  Current Outpatient Medications:    triamcinolone  ointment (KENALOG ) 0.1 %, Apply 1 Application topically 2 (two) times daily.,  Disp: 30 g, Rfl: 1   albuterol  (VENTOLIN  HFA) 108 (90 Base) MCG/ACT inhaler, Inhale 2 puffs into the lungs every 6 (six) hours as needed for wheezing or shortness of breath., Disp: 6.7 g, Rfl: 3   cetirizine  (ZYRTEC  ALLERGY) 10 MG tablet, Take 1 tablet (10 mg total) by mouth at bedtime., Disp: 30 tablet, Rfl: 2   docusate sodium  (COLACE) 100 MG capsule,  Take 1 capsule (100 mg total) by mouth every 12 (twelve) hours., Disp: 30 capsule, Rfl: 0   famotidine (PEPCID AC) 10 MG chewable tablet, Chew 10 mg by mouth 2 (two) times daily., Disp: , Rfl:    fluconazole  (DIFLUCAN ) 150 MG tablet, Take 1 tablet (150 mg total) by mouth as directed. Repeat in 3 days as needed, Disp: 2 tablet, Rfl: 0   ibuprofen  (ADVIL ,MOTRIN ) 800 MG tablet, Take 1 tablet (800 mg total) by mouth every 8 (eight) hours., Disp: 30 tablet, Rfl: 0   naproxen  (NAPROSYN ) 500 MG tablet, Take 1 tablet (500 mg total) by mouth 2 (two) times daily., Disp: 30 tablet, Rfl: 0   predniSONE  (STERAPRED UNI-PAK 21 TAB) 10 MG (21) TBPK tablet, Take 6 tablets on day one, 5 on day two, 4 on day three, 3 on day four, 2 on day five, and 1 on day six. Take with food., Disp: 21 tablet, Rfl: 0  Observations/Objective: Patient is well-developed, well-nourished in no acute distress.  Resting comfortably  at home.  Head is normocephalic, atraumatic.  No labored breathing.  Speech is clear and coherent with logical content.  Patient is alert and oriented at baseline.    Assessment and Plan: 1. Eczema, unspecified type (Primary) - triamcinolone  ointment (KENALOG ) 0.1 %; Apply 1 Application topically 2 (two) times daily.  Dispense: 30 g; Refill: 1  Use Triamcinolone  as prescribed. Follow up with PCP if symptoms don'timprove Schedule a virtual appointment or follow up at an urgent care clinic if symptoms don't improve.  Pt verbalized understanding and in agreement.    Follow Up Instructions: I discussed the assessment and treatment plan with the patient. The patient was provided an opportunity to ask questions and all were answered. The patient agreed with the plan and demonstrated an understanding of the instructions.  A copy of instructions were sent to the patient via MyChart unless otherwise noted below.   Patient has requested to receive PHI (AVS, Work Notes, etc) pertaining to this video visit  through e-mail as they are currently without active MyChart. They have voiced understand that email is not considered secure and their health information could be viewed by someone other than the patient.   The patient was advised to call back or seek an in-person evaluation if the symptoms worsen or if the condition fails to improve as anticipated.    Terese Heier, PA-C

## 2024-04-30 NOTE — Patient Instructions (Signed)
 Docie Darrielle Brand, thank you for joining Wanita Gutta, PA-C for today's virtual visit.  While this provider is not your primary care provider (PCP), if your PCP is located in our provider database this encounter information will be shared with them immediately following your visit.   A [facility] MyChart account gives you access to today's visit and all your visits, tests, and labs performed at [facility] " click here if you don't have a [facility] MyChart account or go to [email or web address]  Consent: (Patient) Sandra Logan provided verbal consent for this virtual visit at the beginning of the encounter.  Current Medications:  Current Outpatient Medications:    triamcinolone  ointment (KENALOG ) 0.1 %, Apply 1 Application topically 2 (two) times daily., Disp: 30 g, Rfl: 1   albuterol  (VENTOLIN  HFA) 108 (90 Base) MCG/ACT inhaler, Inhale 2 puffs into the lungs every 6 (six) hours as needed for wheezing or shortness of breath., Disp: 6.7 g, Rfl: 3   cetirizine  (ZYRTEC  ALLERGY) 10 MG tablet, Take 1 tablet (10 mg total) by mouth at bedtime., Disp: 30 tablet, Rfl: 2   docusate sodium  (COLACE) 100 MG capsule, Take 1 capsule (100 mg total) by mouth every 12 (twelve) hours., Disp: 30 capsule, Rfl: 0   famotidine (PEPCID AC) 10 MG chewable tablet, Chew 10 mg by mouth 2 (two) times daily., Disp: , Rfl:    fluconazole  (DIFLUCAN ) 150 MG tablet, Take 1 tablet (150 mg total) by mouth as directed. Repeat in 3 days as needed, Disp: 2 tablet, Rfl: 0   ibuprofen  (ADVIL ,MOTRIN ) 800 MG tablet, Take 1 tablet (800 mg total) by mouth every 8 (eight) hours., Disp: 30 tablet, Rfl: 0   naproxen  (NAPROSYN ) 500 MG tablet, Take 1 tablet (500 mg total) by mouth 2 (two) times daily., Disp: 30 tablet, Rfl: 0   predniSONE  (STERAPRED UNI-PAK 21 TAB) 10 MG (21) TBPK tablet, Take 6 tablets on day one, 5 on day two, 4 on day three, 3 on day four, 2 on day five, and 1 on day six. Take with  food., Disp: 21 tablet, Rfl: 0   Medications ordered in this encounter:  Meds ordered this encounter  Medications   triamcinolone  ointment (KENALOG ) 0.1 %    Sig: Apply 1 Application topically 2 (two) times daily.    Dispense:  30 g    Refill:  1    Supervising Provider:   Corine Dice [id number]     *If you need refills on other medications prior to your next appointment, please contact your pharmacy*  Follow-Up: Call back or seek an in-person evaluation if the symptoms worsen or if the condition fails to improve as anticipated.  [facility] [phone]  Other Instructions Use Triamcinolone  as prescribed. Follow up with PCP if symptoms don'timprove Schedule a virtual appointment or follow up at an urgent care clinic if symptoms don't improve.    If you have been instructed to have an in-person evaluation today at a local Urgent Care facility, please use the link below. It will take you to a list of all of our available [facility], including address, phone number and hours of operation. Please do not delay care.  [facility]  If you or a family member do not have a primary care provider, use the link below to schedule a visit and establish care. When you choose a [facility] primary care physician or advanced practice provider, you gain a long-term partner in health.  Find a Primary Care Provider  Learn more about [facility]'s in-office and virtual care options: [facility] - Get Care Now

## 2024-05-29 ENCOUNTER — Ambulatory Visit
Admission: RE | Admit: 2024-05-29 | Discharge: 2024-05-29 | Disposition: A | Discharge status: Home / Self Care | Payer: Self-pay | Source: Ambulatory Visit

## 2024-05-29 VITALS — BP 128/85 | HR 75 | Temp 97.8°F | Resp 16

## 2024-05-29 DIAGNOSIS — J45909 Unspecified asthma, uncomplicated: Secondary | ICD-10-CM | POA: Insufficient documentation

## 2024-05-29 DIAGNOSIS — N912 Amenorrhea, unspecified: Secondary | ICD-10-CM

## 2024-05-29 DIAGNOSIS — O99213 Obesity complicating pregnancy, third trimester: Secondary | ICD-10-CM | POA: Insufficient documentation

## 2024-05-29 LAB — POCT URINALYSIS DIP (MANUAL ENTRY)
Bilirubin, UA: NEGATIVE
Blood, UA: NEGATIVE
Glucose, UA: NEGATIVE mg/dL
Ketones, POC UA: NEGATIVE mg/dL
Leukocytes, UA: NEGATIVE
Nitrite, UA: NEGATIVE
Spec Grav, UA: >=1.03 — AB (ref 1.010–1.025)
Urobilinogen, UA: 0.2 U/dL
pH, UA: 6 (ref 5.0–8.0)

## 2024-05-29 LAB — POCT URINE PREGNANCY: Preg Test, Ur: NEGATIVE

## 2024-05-29 NOTE — ED Triage Notes (Signed)
 Pt reports missing her period earlier this month (5/10-5/12) and is concerned for possible pregnancy. Pt has 89yr hx of periods with no irregularity so she is concerned. Pt states she has been under high stress and had small amount of spotting at beginning of May. 3 pregnancy tests in the past month have been negative. Pt does reports cramping lower abdominal pain intermittently. Denies N/V.

## 2024-05-30 NOTE — ED Provider Notes (Signed)
 [facility]    CSN: [id number] Arrival date & time: 05/29/24  1802      History   Chief Complaint Chief Complaint  Patient presents with   Possible Pregnancy    Missed may period and experienced spotting.Sandra AasHowever pregnancy test are negative - Entered by patient    HPI Sandra Logan is a 34 y.o. female.   Pt presents for pregnancy test.  Reports LMP early April.  She reports her menstrual cycles have been regular over the last year.  She is not currently on birth control. She reports spotting during the first week of May.  She does complain of lower abdominal cramping intermittently.  Denies dysuria, fever, chills, vaginal discharge, pelvic pain.      Past Medical History:  Diagnosis Date   Anemia    Asthma    Environmental allergies    IBS (irritable bowel syndrome)    Migraine    S/P cesarean section 12/09/2017   Trichomonas infection    UTI (urinary tract infection)     Patient Active Problem List   Diagnosis Date Noted   Asthma 05/29/2024   Obesity complicating pregnancy, third trimester 05/29/2024   Menorrhagia 03/09/2021   Personal history of COVID-19 02/19/2020   History of migraine 06/16/2018   Irritable bowel syndrome with both constipation and diarrhea 06/16/2018   Seasonal allergies 06/16/2018   PIH (pregnancy induced hypertension) 12/09/2017   S/P cesarean section 12/09/2017   Pregnancy 06/26/2017   Tinea versicolor 09/07/2010   KNEE PAIN, LEFT 12/09/2009   INTERTRIGO 11/11/2009   Pain in joint involving lower leg 11/11/2009   OBESITY, MORBID 08/03/2009   Migraine without aura 08/03/2009   Allergic rhinitis due to pollen 08/03/2009   Exercise induced bronchospasm 08/03/2009   Obesity, morbid (HCC) 08/03/2009    Past Surgical History:  Procedure Laterality Date   CESAREAN SECTION N/A 12/09/2017   Procedure: CESAREAN SECTION;  Surgeon: Margaretmary Shaver, MD;  Location: [facility];  Service: Obstetrics;   Laterality: N/A;   NO PAST SURGERIES      OB History     Gravida  1   Para      Term      Preterm      AB      Living         SAB      IAB      Ectopic      Multiple      Live Births               Home Medications    Prior to Admission medications   Medication Sig Start Date End Date Taking? Authorizing Provider  fluticasone (FLONASE) 50 MCG/ACT nasal spray 1 spray. Patient not taking: Reported on 05/29/2024 01/14/16   [provider]  pantoprazole  (PROTONIX ) 40 MG tablet Take 40 mg by mouth. Patient not taking: Reported on 05/29/2024 02/27/17   [provider]  albuterol  (VENTOLIN  HFA) 108 (90 Base) MCG/ACT inhaler Inhale 2 puffs into the lungs every 6 (six) hours as needed for wheezing or shortness of breath. 02/19/20  Yes Viola Greulich, MD  docusate sodium  (COLACE) 100 MG capsule Take 1 capsule (100 mg total) by mouth every 12 (twelve) hours. Patient not taking: Reported on 05/29/2024 11/16/22   Thomes Flicker, PA  famotidine (PEPCID AC) 10 MG chewable tablet Chew 10 mg by mouth 2 (two) times daily.   Yes [provider]  fluconazole  (DIFLUCAN ) 150 MG tablet Take 1 tablet (150  mg total) by mouth as directed. Repeat in 3 days as needed Patient not taking: Reported on 05/29/2024 01/09/24   Lanetta Pion, NP  ibuprofen  (ADVIL ,MOTRIN ) 800 MG tablet Take 1 tablet (800 mg total) by mouth every 8 (eight) hours. 12/12/17  Yes Meisinger, Todd, MD  lamoTRIgine (LAMICTAL) 25 MG tablet TAKE 1 TAB (25 MG) FOR 1 WEEK, THEN 2 TABS (50 MG) DAILY Patient not taking: Reported on 05/29/2024    [provider]  levonorgestrel-ethinyl estradiol (ALESSE) 0.1-20 MG-MCG tablet Sronyx 0.1 mg-20 mcg tablet  TAKE 1 TABLET BY MOUTH EVERY DAY Patient not taking: Reported on 05/29/2024    [provider]  meloxicam (MOBIC) 15 MG tablet meloxicam 15 mg tablet  TAKE 1 TABLET BY MOUTH EVERY DAY Patient not taking: Reported on 05/29/2024    [provider]  ondansetron  (ZOFRAN ) 4 MG tablet ondansetron  HCl 4 mg tablet  TAKE 1 TAB(S) ORALLY EVERY 8 HOURS, AS NEEDED FOR NAUSEA Patient not taking: Reported on 05/29/2024    [provider]  ondansetron  (ZOFRAN -ODT) 8 MG disintegrating tablet ondansetron  8 mg disintegrating tablet Patient not taking: Reported on 05/29/2024    [provider]  sertraline (ZOLOFT) 50 MG tablet 1/2 TABLET DAILY FOR 7-14 DAYS THEN 1 TABLET DAILY Patient not taking: Reported on 05/29/2024    [provider]  triamcinolone  ointment (KENALOG ) 0.1 % Apply 1 Application topically 2 (two) times daily. 04/30/24  Yes Gandhi, Safal, PA-C  ulipristal acetate (ELLA) 30 MG tablet Ella 30 mg tablet Patient not taking: Reported on 05/29/2024    [provider]    Family History Family History  Problem Relation Age of Onset   Asthma Mother    Depression Mother    Hyperlipidemia Mother    Hypertension Mother    Hypertension Father    Diabetes Father    Arthritis Maternal Grandmother    COPD Maternal Grandmother    Depression Maternal Grandmother    Diabetes Maternal Grandmother    Heart attack Maternal Grandmother    Heart disease Maternal Grandmother    Hypertension Maternal Grandmother    Stroke Maternal Grandmother    Hypertension Maternal Grandfather    Hyperlipidemia Maternal Grandfather    Heart disease Maternal Grandfather    Diabetes Maternal Grandfather    Birth defects Maternal Grandfather    Asthma Maternal Grandfather    Arthritis Maternal Grandfather    Arthritis Paternal Grandmother    Arthritis Paternal Grandfather     Social History Social History   Tobacco Use   Smoking status: Never    Passive exposure: Never   Smokeless tobacco: Never  Vaping Use   Vaping status: Never Used  Substance Use Topics   Alcohol use: No   Drug use: No     Allergies   Patient has no known allergies.   Review of Systems Review of Systems  Constitutional:   Negative for chills and fever.  HENT:  Negative for ear pain and sore throat.   Eyes:  Negative for pain and visual disturbance.  Respiratory:  Negative for cough and shortness of breath.   Cardiovascular:  Negative for chest pain and palpitations.  Gastrointestinal:  Positive for abdominal pain. Negative for vomiting.  Genitourinary:  Negative for dysuria and hematuria.  Musculoskeletal:  Negative for arthralgias and back pain.  Skin:  Negative for color change and rash.  Neurological:  Negative for seizures and syncope.  All other systems reviewed and are negative.    Physical Exam Triage  Vital Signs ED Triage Vitals [05/29/24 1815]  Encounter Vitals Group     BP 128/85     Systolic BP Percentile      Diastolic BP Percentile      Pulse Rate 75     Resp 16     Temp 97.8 F (36.6 C)     Temp Source Oral     SpO2 98 %     Weight      Height      Head Circumference      Peak Flow      Pain Score 0     Pain Loc      Pain Education      Exclude from Growth Chart    No data found.  Updated Vital Signs BP 128/85 (BP Location: Right Wrist)   Pulse 75   Temp 97.8 F (36.6 C) (Oral)   Resp 16   LMP 04/10/2024 (Exact Date)   SpO2 98%   Visual Acuity Right Eye Distance:   Left Eye Distance:   Bilateral Distance:    Right Eye Near:   Left Eye Near:    Bilateral Near:     Physical Exam Vitals and nursing note reviewed.  Constitutional:      General: She is not in acute distress.    Appearance: She is well-developed.  HENT:     Head: Normocephalic and atraumatic.  Eyes:     Conjunctiva/sclera: Conjunctivae normal.  Cardiovascular:     Rate and Rhythm: Normal rate and regular rhythm.     Heart sounds: No murmur heard. Pulmonary:     Effort: Pulmonary effort is normal. No respiratory distress.     Breath sounds: Normal breath sounds.  Abdominal:     Palpations: Abdomen is soft.     Tenderness: There is no abdominal tenderness.  Musculoskeletal:         General: No swelling.     Cervical back: Neck supple.  Skin:    General: Skin is warm and dry.     Capillary Refill: Capillary refill takes less than 2 seconds.  Neurological:     Mental Status: She is alert.  Psychiatric:        Mood and Affect: Mood normal.      UC Treatments / Results  Labs (all labs ordered are listed, but only abnormal results are displayed) Labs Reviewed  POCT URINALYSIS DIP (MANUAL ENTRY) - Abnormal; Notable for the following components:      Result Value   Color, UA other (*)    Clarity, UA hazy (*)    Spec Grav, UA >=1.030 (*)    Protein Ur, POC trace (*)    All other components within normal limits  POCT URINE PREGNANCY - Normal    EKG   Radiology No results found.  Procedures Procedures (including critical care time)  Medications Ordered in UC Medications - No data to display  Initial Impression / Assessment and Plan / UC Course  I have reviewed the triage vital signs and the nursing notes.  Pertinent labs & imaging results that were available during my care of the patient were reviewed by me and considered in my medical decision making (see chart for details).     Negative pregnancy test in office today.  Advised OBGYN follow up regarding amenorrhea.  UA normal.   Final Clinical Impressions(s) / UC Diagnoses   Final diagnoses:  Amenorrhea     Discharge Instructions      Recommend follow up  with OBGYN regarding amenorrhea.    ED Prescriptions   None    PDMP not reviewed this encounter.   Ward, Char Common, PA-C 05/30/24 1038

## 2024-05-30 NOTE — Discharge Instructions (Addendum)
 Recommend follow up with OBGYN regarding amenorrhea.

## 2024-08-20 ENCOUNTER — Telehealth: Admitting: Family Medicine

## 2024-08-20 DIAGNOSIS — N760 Acute vaginitis: Secondary | ICD-10-CM

## 2024-08-20 MED ORDER — FLUCONAZOLE 150 MG PO TABS: 150.0000 mg | ORAL_TABLET | Freq: Once | ORAL | 0 refills | Status: AC

## 2024-08-20 MED ORDER — METRONIDAZOLE 500 MG PO TABS: 500.0000 mg | ORAL_TABLET | Freq: Two times a day (BID) | ORAL | 0 refills | Status: AC

## 2024-08-20 NOTE — Progress Notes (Signed)
 Virtual Visit Consent   Sandra Logan, you are scheduled for a virtual visit with a [facility] provider today. Just as with appointments in the office, your consent must be obtained to participate. Your consent will be active for this visit and any virtual visit you may have with one of our providers in the next 365 days. If you have a MyChart account, a copy of this consent can be sent to you electronically.  As this is a virtual visit, video technology does not allow for your provider to perform a traditional examination. This may limit your provider's ability to fully assess your condition. If your provider identifies any concerns that need to be evaluated in person or the need to arrange testing (such as labs, EKG, etc.), we will make arrangements to do so. Although advances in technology are sophisticated, we cannot ensure that it will always work on either your end or our end. If the connection with a video visit is poor, the visit may have to be switched to a telephone visit. With either a video or telephone visit, we are not always able to ensure that we have a secure connection.  By engaging in this virtual visit, you consent to the provision of healthcare and authorize for your insurance to be billed (if applicable) for the services provided during this visit. Depending on your insurance coverage, you may receive a charge related to this service.  I need to obtain your verbal consent now. Are you willing to proceed with your visit today? Sandra Logan has provided verbal consent on 08/20/2024 for a virtual visit (video or telephone). [name], FNP  Date: 08/20/2024 11:30 AM   Virtual Visit via Video Note   I, [name], connected with  Sandra Logan  ([id number], 08-30-1990) on 08/20/24 at 11:15 AM EDT by a video-enabled telemedicine application and verified that I am speaking with the correct person using two  identifiers.  Location: Patient: Virtual Visit Location Patient: Home Provider: Virtual Visit Location Provider: Home Office   I discussed the limitations of evaluation and management by telemedicine and the availability of in person appointments. The patient expressed understanding and agreed to proceed.    History of Present Illness: [name] is a 34 y.o.  female and is being seen today for vaginal symptoms. She reports internal and external irritation and discharge that started when she returned from the beach. Used a feminine cleanser and that made it worse. White to clear discharge that is thin, mild odor. She reports this feels similar to how BV typically starts for her. She denies any chance of current pregnancy.  HPI:  Problems:  Patient Active Problem List   Diagnosis Date Noted   Asthma 05/29/2024   Obesity complicating pregnancy, third trimester 05/29/2024   Menorrhagia 03/09/2021   Personal history of COVID-19 02/19/2020   History of migraine 06/16/2018   Irritable bowel syndrome with both constipation and diarrhea 06/16/2018   Seasonal allergies 06/16/2018   PIH (pregnancy induced hypertension) 12/09/2017   S/P cesarean section 12/09/2017   Pregnancy 06/26/2017   Tinea versicolor 09/07/2010   KNEE PAIN, LEFT 12/09/2009   INTERTRIGO 11/11/2009   Pain in joint involving lower leg 11/11/2009   OBESITY, MORBID 08/03/2009   Migraine without aura 08/03/2009   Allergic rhinitis due to pollen 08/03/2009   Exercise induced bronchospasm 08/03/2009   Obesity, morbid (HCC) 08/03/2009    Allergies: No Known Allergies Medications:  Current Outpatient Medications:  fluconazole  (DIFLUCAN ) 150 MG tablet, Take 1 tablet (150 mg total) by mouth once for 1 dose. If needed following Flagyl , Disp: 1 tablet, Rfl: 0   metroNIDAZOLE  (FLAGYL ) 500 MG tablet, Take 1 tablet (500 mg total) by mouth 2 (two) times daily for 7 days., Disp: 14 tablet, Rfl: 0   albuterol   (VENTOLIN  HFA) 108 (90 Base) MCG/ACT inhaler, Inhale 2 puffs into the lungs every 6 (six) hours as needed for wheezing or shortness of breath., Disp: 6.7 g, Rfl: 3   docusate sodium  (COLACE) 100 MG capsule, Take 1 capsule (100 mg total) by mouth every 12 (twelve) hours. (Patient not taking: Reported on 05/29/2024), Disp: 30 capsule, Rfl: 0   famotidine (PEPCID AC) 10 MG chewable tablet, Chew 10 mg by mouth 2 (two) times daily., Disp: , Rfl:    fluconazole  (DIFLUCAN ) 150 MG tablet, Take 1 tablet (150 mg total) by mouth as directed. Repeat in 3 days as needed (Patient not taking: Reported on 05/29/2024), Disp: 2 tablet, Rfl: 0   fluticasone (FLONASE) 50 MCG/ACT nasal spray, 1 spray. (Patient not taking: Reported on 05/29/2024), Disp: , Rfl:    ibuprofen  (ADVIL ,MOTRIN ) 800 MG tablet, Take 1 tablet (800 mg total) by mouth every 8 (eight) hours., Disp: 30 tablet, Rfl: 0   lamoTRIgine (LAMICTAL) 25 MG tablet, TAKE 1 TAB (25 MG) FOR 1 WEEK, THEN 2 TABS (50 MG) DAILY (Patient not taking: Reported on 05/29/2024), Disp: , Rfl:    levonorgestrel-ethinyl estradiol (ALESSE) 0.1-20 MG-MCG tablet, Sronyx 0.1 mg-20 mcg tablet  TAKE 1 TABLET BY MOUTH EVERY DAY (Patient not taking: Reported on 05/29/2024), Disp: , Rfl:    meloxicam (MOBIC) 15 MG tablet, meloxicam 15 mg tablet  TAKE 1 TABLET BY MOUTH EVERY DAY (Patient not taking: Reported on 05/29/2024), Disp: , Rfl:    ondansetron  (ZOFRAN ) 4 MG tablet, ondansetron  HCl 4 mg tablet  TAKE 1 TAB(S) ORALLY EVERY 8 HOURS, AS NEEDED FOR NAUSEA (Patient not taking: Reported on 05/29/2024), Disp: , Rfl:    ondansetron  (ZOFRAN -ODT) 8 MG disintegrating tablet, ondansetron  8 mg disintegrating tablet (Patient not taking: Reported on 05/29/2024), Disp: , Rfl:    pantoprazole  (PROTONIX ) 40 MG tablet, Take 40 mg by mouth. (Patient not taking: Reported on 05/29/2024), Disp: , Rfl:    sertraline (ZOLOFT) 50 MG tablet, 1/2 TABLET DAILY FOR 7-14 DAYS THEN 1 TABLET DAILY (Patient not taking: Reported  on 05/29/2024), Disp: , Rfl:    triamcinolone  ointment (KENALOG ) 0.1 %, Apply 1 Application topically 2 (two) times daily., Disp: 30 g, Rfl: 1   ulipristal acetate (ELLA) 30 MG tablet, Ella 30 mg tablet (Patient not taking: Reported on 05/29/2024), Disp: , Rfl:   Observations/Objective: Patient is well-developed, well-nourished in no acute distress.  Resting comfortably  at home.  Head is normocephalic, atraumatic.  No labored breathing.  Speech is clear and coherent with logical content.  Patient is alert and oriented at baseline.   Assessment and Plan: 1. Acute vaginitis (Primary) - metroNIDAZOLE  (FLAGYL ) 500 MG tablet; Take 1 tablet (500 mg total) by mouth 2 (two) times daily for 7 days.  Dispense: 14 tablet; Refill: 0 - fluconazole  (DIFLUCAN ) 150 MG tablet; Take 1 tablet (150 mg total) by mouth once for 1 dose. If needed following Flagyl   Dispense: 1 tablet; Refill: 0  Start Flagyl  today. Discussed Diflucan  as needed if yeast symptoms develop after using the antibiotic. Advised if she has another episode of vaginitis symptoms over the next few months I would recommend follow up with  PCP or OBGYN to complete a swab and make sure her symptoms are being treated appropriately.  Follow Up Instructions: I discussed the assessment and treatment plan with the patient. The patient was provided an opportunity to ask questions and all were answered. The patient agreed with the plan and demonstrated an understanding of the instructions.  A copy of instructions were sent to the patient via MyChart unless otherwise noted below.    The patient was advised to call back or seek an in-person evaluation if the symptoms worsen or if the condition fails to improve as anticipated.    [name], FNP

## 2024-08-20 NOTE — Patient Instructions (Signed)
 Sandra Logan, thank you for joining Sandra [name], FNP for today's virtual visit.  While this provider is not your primary care provider (PCP), if your PCP is located in our provider database this encounter information will be shared with them immediately following your visit.   Sandra Logan MyChart account gives you access to today's visit and all your visits, tests, and labs performed at [facility]  click here if you don't have Sandra [facility] MyChart account or go to [email or web address]  Consent: (Patient) Sandra Logan provided verbal consent for this virtual visit at the beginning of the encounter.  Current Medications:  Current Outpatient Medications:    fluconazole  (DIFLUCAN ) 150 MG tablet, Take 1 tablet (150 mg total) by mouth once for 1 dose. If needed following Flagyl , Disp: 1 tablet, Rfl: 0   metroNIDAZOLE  (FLAGYL ) 500 MG tablet, Take 1 tablet (500 mg total) by mouth 2 (two) times daily for 7 days., Disp: 14 tablet, Rfl: 0   albuterol  (VENTOLIN  HFA) 108 (90 Base) MCG/ACT inhaler, Inhale 2 puffs into the lungs every 6 (six) hours as needed for wheezing or shortness of breath., Disp: 6.7 g, Rfl: 3   docusate sodium  (COLACE) 100 MG capsule, Take 1 capsule (100 mg total) by mouth every 12 (twelve) hours. (Patient not taking: Reported on 05/29/2024), Disp: 30 capsule, Rfl: 0   famotidine (PEPCID AC) 10 MG chewable tablet, Chew 10 mg by mouth 2 (two) times daily., Disp: , Rfl:    fluconazole  (DIFLUCAN ) 150 MG tablet, Take 1 tablet (150 mg total) by mouth as directed. Repeat in 3 days as needed (Patient not taking: Reported on 05/29/2024), Disp: 2 tablet, Rfl: 0   fluticasone (FLONASE) 50 MCG/ACT nasal spray, 1 spray. (Patient not taking: Reported on 05/29/2024), Disp: , Rfl:    ibuprofen  (ADVIL ,MOTRIN ) 800 MG tablet, Take 1 tablet (800 mg total) by mouth every 8 (eight) hours., Disp: 30 tablet, Rfl: 0   lamoTRIgine (LAMICTAL) 25 MG tablet, TAKE 1  TAB (25 MG) FOR 1 WEEK, THEN 2 TABS (50 MG) DAILY (Patient not taking: Reported on 05/29/2024), Disp: , Rfl:    levonorgestrel-ethinyl estradiol (ALESSE) 0.1-20 MG-MCG tablet, Sronyx 0.1 mg-20 mcg tablet  TAKE 1 TABLET BY MOUTH EVERY DAY (Patient not taking: Reported on 05/29/2024), Disp: , Rfl:    meloxicam (MOBIC) 15 MG tablet, meloxicam 15 mg tablet  TAKE 1 TABLET BY MOUTH EVERY DAY (Patient not taking: Reported on 05/29/2024), Disp: , Rfl:    ondansetron  (ZOFRAN ) 4 MG tablet, ondansetron  HCl 4 mg tablet  TAKE 1 TAB(S) ORALLY EVERY 8 HOURS, AS NEEDED FOR NAUSEA (Patient not taking: Reported on 05/29/2024), Disp: , Rfl:    ondansetron  (ZOFRAN -ODT) 8 MG disintegrating tablet, ondansetron  8 mg disintegrating tablet (Patient not taking: Reported on 05/29/2024), Disp: , Rfl:    pantoprazole  (PROTONIX ) 40 MG tablet, Take 40 mg by mouth. (Patient not taking: Reported on 05/29/2024), Disp: , Rfl:    sertraline (ZOLOFT) 50 MG tablet, 1/2 TABLET DAILY FOR 7-14 DAYS THEN 1 TABLET DAILY (Patient not taking: Reported on 05/29/2024), Disp: , Rfl:    triamcinolone  ointment (KENALOG ) 0.1 %, Apply 1 Application topically 2 (two) times daily., Disp: 30 g, Rfl: 1   ulipristal acetate (ELLA) 30 MG tablet, Ella 30 mg tablet (Patient not taking: Reported on 05/29/2024), Disp: , Rfl:    Medications ordered in this encounter:  Meds ordered this encounter  Medications   metroNIDAZOLE  (FLAGYL ) 500 MG tablet    Sig: Take 1  tablet (500 mg total) by mouth 2 (two) times daily for 7 days.    Dispense:  14 tablet    Refill:  0    Supervising Provider:   BLAISE ALEENE KIDD [[id number]]   fluconazole  (DIFLUCAN ) 150 MG tablet    Sig: Take 1 tablet (150 mg total) by mouth once for 1 dose. If needed following Flagyl     Dispense:  1 tablet    Refill:  0    Supervising Provider:   LAMPTEY, PHILIP O [[id number]]     *If you need refills on other medications prior to your next appointment, please contact your pharmacy*  Follow-Up: Call back  or seek an in-person evaluation if the symptoms worsen or if the condition fails to improve as anticipated.  [facility] [phone]  Other Instructions Start Flagyl  today. Discussed Diflucan  as needed if yeast symptoms develop after using the antibiotic. If another episode of vaginitis symptoms occurs over the next few months I would recommend follow up with PCP or OBGYN to complete Sandra swab and make sure your symptoms are being treated appropriately.   If you have been instructed to have an in-person evaluation today at Sandra local Urgent Care facility, please use the link below. It will take you to Sandra list of all of our available [facility], including address, phone number and hours of operation. Please do not delay care.  [facility]  If you or Sandra family member do not have Sandra primary care provider, use the link below to schedule Sandra visit and establish care. When you choose Sandra [facility] primary care physician or advanced practice provider, you gain Sandra long-term partner in health. Find Sandra Primary Care Provider  Learn more about [facility]'s in-office and virtual care options: [facility] - Get Care Now

## 2024-09-07 ENCOUNTER — Other Ambulatory Visit: Payer: Self-pay

## 2024-09-07 ENCOUNTER — Emergency Department (HOSPITAL_BASED_OUTPATIENT_CLINIC_OR_DEPARTMENT_OTHER)
Admission: EM | Admit: 2024-09-07 | Discharge: 2024-09-07 | Disposition: A | Discharge status: Home / Self Care | Attending: Emergency Medicine | Admitting: Emergency Medicine

## 2024-09-07 ENCOUNTER — Encounter (HOSPITAL_BASED_OUTPATIENT_CLINIC_OR_DEPARTMENT_OTHER): Payer: Self-pay

## 2024-09-07 DIAGNOSIS — N912 Amenorrhea, unspecified: Secondary | ICD-10-CM | POA: Insufficient documentation

## 2024-09-07 LAB — PREGNANCY, URINE: Preg Test, Ur: NEGATIVE

## 2024-09-07 NOTE — ED Provider Notes (Signed)
 [place] EMERGENCY DEPARTMENT AT [facility] Provider Note   CSN: [id number] Arrival date & time: 09/07/24  1951     Patient presents with: Possible Pregnancy   Sandra Logan is a 34 y.o. female.  {Add pertinent medical, surgical, social history, OB history to HPI:6046} 34 year old female presenting for pregnancy test. Reported positive faint home pregnancy test earlier today. LMP July 16, history of amenorrhea but states it is never gone this long without starting. No abdominal pain, dysuria, vaginal itching/discharge/odor.    Possible Pregnancy       Prior to Admission medications   Medication Sig Start Date End Date Taking? Authorizing Provider  albuterol  (VENTOLIN  HFA) 108 (90 Base) MCG/ACT inhaler Inhale 2 puffs into the lungs every 6 (six) hours as needed for wheezing or shortness of breath. 02/19/20   Mercer Clotilda SAUNDERS, MD  docusate sodium  (COLACE) 100 MG capsule Take 1 capsule (100 mg total) by mouth every 12 (twelve) hours. Patient not taking: Reported on 05/29/2024 11/16/22   Ladora Congress, PA  famotidine (PEPCID AC) 10 MG chewable tablet Chew 10 mg by mouth 2 (two) times daily.    [provider]  fluconazole  (DIFLUCAN ) 150 MG tablet Take 1 tablet (150 mg total) by mouth as directed. Repeat in 3 days as needed Patient not taking: Reported on 05/29/2024 01/09/24   Moishe Chiquita HERO, NP  fluticasone (FLONASE) 50 MCG/ACT nasal spray 1 spray. Patient not taking: Reported on 05/29/2024 01/14/16   [provider]  ibuprofen  (ADVIL ,MOTRIN ) 800 MG tablet Take 1 tablet (800 mg total) by mouth every 8 (eight) hours. 12/12/17   Meisinger, Krystal, MD  lamoTRIgine (LAMICTAL) 25 MG tablet TAKE 1 TAB (25 MG) FOR 1 WEEK, THEN 2 TABS (50 MG) DAILY Patient not taking: Reported on 05/29/2024    [provider]  levonorgestrel-ethinyl estradiol (ALESSE) 0.1-20 MG-MCG tablet Sronyx 0.1 mg-20 mcg tablet  TAKE 1 TABLET BY MOUTH EVERY DAY Patient not  taking: Reported on 05/29/2024    [provider]  meloxicam (MOBIC) 15 MG tablet meloxicam 15 mg tablet  TAKE 1 TABLET BY MOUTH EVERY DAY Patient not taking: Reported on 05/29/2024    [provider]  ondansetron  (ZOFRAN ) 4 MG tablet ondansetron  HCl 4 mg tablet  TAKE 1 TAB(S) ORALLY EVERY 8 HOURS, AS NEEDED FOR NAUSEA Patient not taking: Reported on 05/29/2024    [provider]  ondansetron  (ZOFRAN -ODT) 8 MG disintegrating tablet ondansetron  8 mg disintegrating tablet Patient not taking: Reported on 05/29/2024    [provider]  pantoprazole  (PROTONIX ) 40 MG tablet Take 40 mg by mouth. Patient not taking: Reported on 05/29/2024 02/27/17   [provider]  sertraline (ZOLOFT) 50 MG tablet 1/2 TABLET DAILY FOR 7-14 DAYS THEN 1 TABLET DAILY Patient not taking: Reported on 05/29/2024    [provider]  triamcinolone  ointment (KENALOG ) 0.1 % Apply 1 Application topically 2 (two) times daily. 04/30/24   Gandhi, Safal, PA-C  ulipristal acetate (ELLA) 30 MG tablet Ella 30 mg tablet Patient not taking: Reported on 05/29/2024    [provider]    Allergies: Patient has no known allergies.    Review of Systems  Updated Vital Signs BP (!) 150/95   Pulse 80   Temp 98.4 F (36.9 C) (Oral)   Resp 18   LMP 04/10/2024 (Exact Date)   SpO2 100%   Physical Exam Vitals and nursing note reviewed.  HENT:     Head: Normocephalic.  Eyes:  Extraocular Movements: Extraocular movements intact.  Cardiovascular:     Rate and Rhythm: Normal rate.  Pulmonary:     Effort: Pulmonary effort is normal.  Abdominal:     Palpations: Abdomen is soft.     Tenderness: There is no abdominal tenderness. There is no guarding.  Musculoskeletal:     Cervical back: Normal range of motion.     Comments: Moves all extremities spontaneously without difficulty  Skin:    General: Skin is warm and dry.  Neurological:     Mental Status: She is alert and  oriented to person, place, and time.     (all labs ordered are listed, but only abnormal results are displayed) Labs Reviewed  PREGNANCY, URINE    EKG: None  Radiology: No results found.  {Document cardiac monitor, telemetry assessment procedure when appropriate:32947} Procedures   Medications Ordered in the ED - No data to display    {Click here for ABCD2, HEART and other calculators REFRESH Note before signing:1}                              Medical Decision Making This patient presents to the ED for concern of ***, this involves an extensive number of treatment options, and is a complaint that carries with it a high risk of complications and morbidity.  The differential diagnosis includes ***   Co morbidities that complicate the patient evaluation  ***   Additional history obtained:  Additional history obtained from *** External records from outside source obtained and reviewed including ***   Lab Tests:  I Ordered, and personally interpreted labs.  The pertinent results include:  ***    Imaging Studies ordered:  I ordered imaging studies including ***  I independently visualized and interpreted imaging which showed *** I agree with the radiologist interpretation   Cardiac Monitoring: / EKG:  The patient was maintained on a cardiac monitor.  I personally viewed and interpreted the cardiac monitored which showed an underlying rhythm of: ***   Consultations Obtained:  I requested consultation with the ***,  and discussed lab and imaging findings as well as pertinent plan - they recommend: ***   Problem List / ED Course / Critical interventions / Medication management  *** I ordered medication including ***  for ***  Reevaluation of the patient after these medicines showed that the patient {resolved/improved/worsened:23923::improved} I have reviewed the patients home medicines and have made adjustments as needed   Social Determinants of  Health:  ***   Test / Admission - Considered:  ***    Amount and/or Complexity of Data Reviewed Labs: ordered.   ***  {Document critical care time when appropriate  Document review of labs and clinical decision tools ie CHADS2VASC2, etc  Document your independent review of radiology images and any outside records  Document your discussion with family members, caretakers and with consultants  Document social determinants of health affecting pt's care  Document your decision making why or why not admission, treatments were needed:32947:::1}   Final diagnoses:  None    ED Discharge Orders     None

## 2024-09-07 NOTE — ED Triage Notes (Signed)
 Pt presents via POV c/o positive pregnancy test at home. Requesting test here for confirmation.

## 2024-09-07 NOTE — Discharge Instructions (Signed)
 Follow-up with your OBGYN in regard to your irregular periods. Return to the emergency department if needed.

## 2024-10-22 DIAGNOSIS — O3429 Maternal care due to uterine scar from other previous surgery: Secondary | ICD-10-CM | POA: Insufficient documentation

## 2024-11-09 DIAGNOSIS — O99340 Other mental disorders complicating pregnancy, unspecified trimester: Secondary | ICD-10-CM | POA: Insufficient documentation

## 2024-12-09 DIAGNOSIS — O09529 Supervision of elderly multigravida, unspecified trimester: Secondary | ICD-10-CM | POA: Insufficient documentation

## 2024-12-10 DIAGNOSIS — O24419 Gestational diabetes mellitus in pregnancy, unspecified control: Secondary | ICD-10-CM | POA: Insufficient documentation

## 2024-12-15 ENCOUNTER — Other Ambulatory Visit: Payer: Self-pay | Admitting: Student

## 2024-12-15 DIAGNOSIS — O99210 Obesity complicating pregnancy, unspecified trimester: Secondary | ICD-10-CM

## 2025-01-12 DIAGNOSIS — Z8759 Personal history of other complications of pregnancy, childbirth and the puerperium: Secondary | ICD-10-CM | POA: Insufficient documentation

## 2025-01-12 DIAGNOSIS — Z98891 History of uterine scar from previous surgery: Secondary | ICD-10-CM | POA: Insufficient documentation

## 2025-01-26 ENCOUNTER — Ambulatory Visit (HOSPITAL_BASED_OUTPATIENT_CLINIC_OR_DEPARTMENT_OTHER): Admitting: Obstetrics and Gynecology

## 2025-01-26 ENCOUNTER — Ambulatory Visit: Attending: Student

## 2025-01-26 VITALS — BP 133/68

## 2025-01-26 DIAGNOSIS — O34219 Maternal care for unspecified type scar from previous cesarean delivery: Secondary | ICD-10-CM | POA: Diagnosis not present

## 2025-01-26 DIAGNOSIS — Z3A21 21 weeks gestation of pregnancy: Secondary | ICD-10-CM | POA: Insufficient documentation

## 2025-01-26 DIAGNOSIS — O24419 Gestational diabetes mellitus in pregnancy, unspecified control: Secondary | ICD-10-CM | POA: Diagnosis not present

## 2025-01-26 DIAGNOSIS — O99212 Obesity complicating pregnancy, second trimester: Secondary | ICD-10-CM | POA: Diagnosis not present

## 2025-01-26 DIAGNOSIS — O09292 Supervision of pregnancy with other poor reproductive or obstetric history, second trimester: Secondary | ICD-10-CM | POA: Diagnosis not present

## 2025-01-26 DIAGNOSIS — O99210 Obesity complicating pregnancy, unspecified trimester: Secondary | ICD-10-CM | POA: Insufficient documentation

## 2025-01-26 DIAGNOSIS — Z8759 Personal history of other complications of pregnancy, childbirth and the puerperium: Secondary | ICD-10-CM | POA: Insufficient documentation

## 2025-01-26 DIAGNOSIS — Z98891 History of uterine scar from previous surgery: Secondary | ICD-10-CM | POA: Insufficient documentation

## 2025-01-26 DIAGNOSIS — O24410 Gestational diabetes mellitus in pregnancy, diet controlled: Secondary | ICD-10-CM | POA: Insufficient documentation

## 2025-01-26 NOTE — Progress Notes (Signed)
 Maternal-Fetal Medicine Consultation  Name: Sandra Logan  MRN: [id number]  GA: [id number] [redacted]w[redacted]d   Patient is here for fetal anatomy scan.  Her high risk problems include: - Pregravid BMI 60.9. - Previous cesarean delivery.  Obstetrical history significant for a term cesarean delivery 7 years ago that was performed because of nonreassuring fetal heart tracing. - Gestational diabetes.  Early diagnosis and the patient reports that it is well-controlled on diet. Her pregnancy is dated by 7-week ultrasound. On cell-free fetal DNA screening, the risks of fetal aneuploidy's are not increased.  Ultrasound Fetal biometry is consistent with the previously established dates.  Normal amniotic fluid.  No markers of aneuploidies or obvious fetal structural defects are seen.  Fetal anatomical survey could not be completed because of fetal position and maternal body habitus.  Placenta is posterior and there is no evidence of previa or placenta accreta spectrum. As obesity imposes limitations on the resolution of images, fetal anomalies may be missed.  Gestational diabetes I discussed self-blood glucose monitoring 4 times daily and the normal parameters for fasting and postprandial levels.  Discussed the importance of exercise and reducing hyperglycemia.  If diabetes is not well-controlled, oral hypoglycemics or insulin should be initiated. I counseled the patient that 25% to 40% of women develop type 2 diabetes after delivery.  I recommended screening for type 2 diabetes 6 to 12 weeks after delivery.  Previous cesarean delivery I reassured the patient of normal placental location and that there is no evidence of previa or placenta accreta spectrum.   I discussed the benefits and risks of vaginal birth after cesarean section (VBAC).  The risk of uterine scar dehiscence is about 1%.  Cesarean deliveries increase the risks of hemorrhage, infection and venous thromboembolism.  Repeat cesarean  deliveries increase the risks of placenta previa and/or placenta accreta spectrum. Long-term complications include small bowel obstruction.  Grade 3 obesity is an independent risk factor for increased perinatal mortality, and because of associated diabetes, we recommend weekly antenatal testing from [redacted] weeks gestation till delivery.  Recommendations -An appointment was made for her to return in 5 weeks for completion of fetal anatomy. - Fetal growth assessments every 4 weeks till delivery. - Weekly antenatal testing from [redacted] weeks gestation until delivery that may be performed at your office. - VBAC counseling the third trimester.  If patient opts for repeat cesarean delivery, it may be performed at 39-weeks' gestation.     Consultation including face-to-face (more than 50%) counseling 35 minutes.

## 2025-03-04 ENCOUNTER — Ambulatory Visit
